# Patient Record
Sex: Female | Born: 1980 | Hispanic: Yes | Marital: Single | Smoking: Never smoker
Health system: Southern US, Community
[De-identification: ages and names within clinical notes are randomized; demographics above are authoritative.]

## PROBLEM LIST (undated history)

## (undated) DIAGNOSIS — D649 Anemia, unspecified: Secondary | ICD-10-CM

## (undated) DIAGNOSIS — E049 Nontoxic goiter, unspecified: Secondary | ICD-10-CM

## (undated) DIAGNOSIS — E663 Overweight: Secondary | ICD-10-CM

## (undated) DIAGNOSIS — D219 Benign neoplasm of connective and other soft tissue, unspecified: Secondary | ICD-10-CM

## (undated) HISTORY — DX: Overweight: E66.3

## (undated) HISTORY — PX: LUNG SURGERY: SHX703

## (undated) HISTORY — PX: LUNG LOBECTOMY: SHX167

---

## 2004-07-05 ENCOUNTER — Ambulatory Visit: Payer: Self-pay | Admitting: Family Medicine

## 2007-02-11 ENCOUNTER — Emergency Department: Payer: Self-pay | Admitting: Internal Medicine

## 2010-11-27 ENCOUNTER — Emergency Department: Payer: Self-pay | Admitting: *Deleted

## 2015-01-24 ENCOUNTER — Other Ambulatory Visit: Payer: Self-pay | Admitting: Advanced Practice Midwife

## 2015-01-24 ENCOUNTER — Ambulatory Visit
Admission: RE | Admit: 2015-01-24 | Discharge: 2015-01-24 | Disposition: A | Discharge status: Home / Self Care | Payer: Medicaid Other | Source: Ambulatory Visit | Attending: Advanced Practice Midwife | Admitting: Advanced Practice Midwife

## 2015-01-24 DIAGNOSIS — O26891 Other specified pregnancy related conditions, first trimester: Secondary | ICD-10-CM | POA: Diagnosis not present

## 2015-01-24 DIAGNOSIS — Z3A09 9 weeks gestation of pregnancy: Secondary | ICD-10-CM | POA: Diagnosis not present

## 2015-01-24 DIAGNOSIS — Z369 Encounter for antenatal screening, unspecified: Secondary | ICD-10-CM

## 2015-01-24 DIAGNOSIS — R1030 Lower abdominal pain, unspecified: Secondary | ICD-10-CM

## 2015-01-25 LAB — OB RESULTS CONSOLE HEPATITIS B SURFACE ANTIGEN: Hepatitis B Surface Ag: NEGATIVE

## 2015-01-25 LAB — OB RESULTS CONSOLE RUBELLA ANTIBODY, IGM: RUBELLA: IMMUNE

## 2015-01-25 LAB — OB RESULTS CONSOLE VARICELLA ZOSTER ANTIBODY, IGG: VARICELLA IGG: IMMUNE

## 2015-01-25 LAB — OB RESULTS CONSOLE HIV ANTIBODY (ROUTINE TESTING): HIV: NONREACTIVE

## 2015-01-25 LAB — OB RESULTS CONSOLE ABO/RH: RH TYPE: POSITIVE

## 2015-01-25 LAB — OB RESULTS CONSOLE RPR: RPR: NONREACTIVE

## 2015-01-25 LAB — OB RESULTS CONSOLE GC/CHLAMYDIA
CHLAMYDIA, DNA PROBE: NEGATIVE
Gonorrhea: NEGATIVE

## 2015-01-25 LAB — OB RESULTS CONSOLE ANTIBODY SCREEN: Antibody Screen: NEGATIVE

## 2015-02-02 ENCOUNTER — Encounter: Payer: Self-pay | Admitting: Emergency Medicine

## 2015-02-02 ENCOUNTER — Emergency Department
Admission: EM | Admit: 2015-02-02 | Discharge: 2015-02-02 | Disposition: A | Discharge status: Home / Self Care | Payer: Medicaid Other | Attending: Emergency Medicine | Admitting: Emergency Medicine

## 2015-02-02 ENCOUNTER — Emergency Department: Payer: Medicaid Other

## 2015-02-02 DIAGNOSIS — F419 Anxiety disorder, unspecified: Secondary | ICD-10-CM | POA: Diagnosis not present

## 2015-02-02 DIAGNOSIS — O200 Threatened abortion: Secondary | ICD-10-CM

## 2015-02-02 DIAGNOSIS — Z3A10 10 weeks gestation of pregnancy: Secondary | ICD-10-CM | POA: Diagnosis not present

## 2015-02-02 DIAGNOSIS — O99341 Other mental disorders complicating pregnancy, first trimester: Secondary | ICD-10-CM | POA: Insufficient documentation

## 2015-02-02 DIAGNOSIS — O209 Hemorrhage in early pregnancy, unspecified: Secondary | ICD-10-CM | POA: Diagnosis present

## 2015-02-02 LAB — URINALYSIS COMPLETE WITH MICROSCOPIC (ARMC ONLY)
Bilirubin Urine: NEGATIVE
Glucose, UA: NEGATIVE mg/dL
Ketones, ur: NEGATIVE mg/dL
LEUKOCYTES UA: NEGATIVE
Nitrite: NEGATIVE
PH: 7 (ref 5.0–8.0)
PROTEIN: NEGATIVE mg/dL
Specific Gravity, Urine: 1.009 (ref 1.005–1.030)

## 2015-02-02 LAB — CBC
HCT: 36.6 % (ref 35.0–47.0)
Hemoglobin: 12.4 g/dL (ref 12.0–16.0)
MCH: 29.4 pg (ref 26.0–34.0)
MCHC: 33.8 g/dL (ref 32.0–36.0)
MCV: 86.9 fL (ref 80.0–100.0)
PLATELETS: 210 10*3/uL (ref 150–440)
RBC: 4.21 MIL/uL (ref 3.80–5.20)
RDW: 17.3 % — AB (ref 11.5–14.5)
WBC: 8 10*3/uL (ref 3.6–11.0)

## 2015-02-02 LAB — HCG, QUANTITATIVE, PREGNANCY: HCG, BETA CHAIN, QUANT, S: 86007 m[IU]/mL — AB (ref <5)

## 2015-02-02 LAB — ABO/RH: ABO/RH(D): O POS

## 2015-02-02 NOTE — Discharge Instructions (Signed)
Hemorragia vaginal durante el embarazo (primer trimestre) °(Vaginal Bleeding During Pregnancy, First Trimester) °Durante los primeros meses de embarazo, es común tener una pequeña hemorragia vaginal (manchas). A veces, la hemorragia es normal y no representa un problema, pero en algunas ocasiones es un síntoma de algo grave. Asegúrese de decirle a su médico de inmediato si tiene algún tipo de hemorragia vaginal. °CUIDADOS EN EL HOGAR °· Controle su afección para ver si hay cambios. °· Siga las indicaciones de su médico con respecto al grado de actividad que puede tener. °· Si debe hacer reposo en cama: °¨ Es posible que deba quedarse en cama y levantarse únicamente para ir al baño. °¨ Quizás le permitan hacer algunas actividades. °¨ Si es necesario, planifique que alguien la ayude. °· Escriba: °¨ La cantidad de toallas higiénicas que usa cada día. °¨ La frecuencia con la que se cambia las toallas higiénicas. °¨ Indique que tan empapados (saturados) están. °· No use tampones. °· No se haga duchas vaginales. °· No tenga relaciones sexuales ni orgasmos hasta que el médico la autorice. °· Si elimina tejido por la vagina, guárdelo para mostrárselo al médico. °· Tome los medicamentos solamente como se lo haya indicado el médico. °· No tome aspirina, ya que puede causar hemorragias. °· Concurra a todas las visitas de control como se lo haya indicado el médico. °SOLICITE AYUDA SI:  °· Tiene una hemorragia vaginal. °· Tiene cólicos. °· Tiene dolores de parto. °· Tiene fiebre que no desaparece después de tomar medicamentos. °SOLICITE AYUDA DE INMEDIATO SI:  °· Siente cólicos muy intensos en la espalda o en el vientre (abdomen). °· Elimina coágulos grandes o tejido por la vagina. °· Tiene más hemorragia. °· Se siente débil o que va a desvanecerse. °· Pierde el conocimiento (se desmaya). °· Tiene escalofríos. °· Tiene una pérdida importante o sale líquido a borbotones por la vagina. °· Se desmaya mientras defeca. °ASEGÚRESE DE  QUE: °· Comprende estas instrucciones. °· Controlará su afección. °· Recibirá ayuda de inmediato si no mejora o si empeora. °Document Released: 10/11/2013 °ExitCare® Patient Information ©2015 [employer]. This information is not intended to replace advice given to you by your health care provider. Make sure you discuss any questions you have with your health care provider. ° °

## 2015-02-02 NOTE — ED Provider Notes (Signed)
[facility] Emergency Department Provider Note  ____________________________________________  Time seen: On arrival  I have reviewed the triage vital signs and the nursing notes.   HISTORY  Chief Complaint Vaginal Bleeding   Spanish interpreter used HPI Sheri Mack is a 34 y.o. female who presents with complaints of vaginal bleeding. She reports she is approximately [redacted] weeks pregnant. This her first pregnancy. She denies abdominal pain. No fevers no chills. No dysuria. She reports spotting noted only when she wipes after using the restroom.      History reviewed. No pertinent past medical history.  There are no active problems to display for this patient.   Past Surgical History  Procedure Laterality Date  . Lung surgery      Current Outpatient Rx  Name  Route  Sig  Dispense  Refill  . Prenatal Vit-Fe Fumarate-FA (PRENATAL MULTIVITAMIN) TABS tablet   Oral   Take 1 tablet by mouth daily at 12 noon.           Allergies Review of patient's allergies indicates no known allergies.  No family history on file.  Social History Social History  Substance Use Topics  . Smoking status: Never Smoker   . Smokeless tobacco: None  . Alcohol Use: No    Review of Systems  Constitutional: Negative for fever. Eyes: Negative for visual changes. ENT: Negative for sore throat Cardiovascular: Negative for chest pain. Respiratory: Negative for shortness of breath. Gastrointestinal: Negative for abdominal pain, vomiting and diarrhea. Genitourinary: Negative for dysuria. Positive for vaginal bleeding Musculoskeletal: Negative for back pain. Skin: Negative for rash. Neurological: Negative for headaches or focal weakness Psychiatric: Mild anxiety    ____________________________________________   PHYSICAL EXAM:  VITAL SIGNS: ED Triage Vitals  Enc Vitals Group     BP 02/02/15 0928 111/64 mmHg     Pulse Rate 02/02/15 0928 77     Resp  02/02/15 0928 14     Temp 02/02/15 0928 97.6 F (36.4 C)     Temp Source 02/02/15 0928 Oral     SpO2 02/02/15 0928 99 %     Weight 02/02/15 0928 157 lb (71.215 kg)     Height 02/02/15 0928 5' (1.524 m)     Head Cir --      Peak Flow --      Pain Score 02/02/15 0931 4     Pain Loc --      Pain Edu? --      Excl. in [place]? --      Constitutional: Alert and oriented. Well appearing and in no distress. Eyes: Conjunctivae are normal.  ENT   Head: Normocephalic and atraumatic.   Mouth/Throat: Mucous membranes are moist. Cardiovascular: Normal rate, regular rhythm. Normal and symmetric distal pulses are present in all extremities. No murmurs, rubs, or gallops. Respiratory: Normal respiratory effort without tachypnea nor retractions. Breath sounds are clear and equal bilaterally.  Gastrointestinal: Soft and non-tender in all quadrants. No distention. There is no CVA tenderness. Genitourinary: deferred Musculoskeletal: Nontender with normal range of motion in all extremities. No lower extremity tenderness nor edema. Neurologic:  Normal speech and language. No gross focal neurologic deficits are appreciated. Skin:  Skin is warm, dry and intact. No rash noted. Psychiatric: Mood and affect are normal. Patient exhibits appropriate insight and judgment.  ____________________________________________    LABS (pertinent positives/negatives)  Labs Reviewed  HCG, QUANTITATIVE, PREGNANCY - Abnormal; Notable for the following:    hCG, Beta Neomia Dear [address]007 (*)  All other components within normal limits  URINALYSIS COMPLETEWITH MICROSCOPIC ([facility] ONLY) - Abnormal; Notable for the following:    Color, Urine YELLOW (*)    APPearance CLEAR (*)    Hgb urine dipstick 1+ (*)    Bacteria, UA RARE (*)    Squamous Epithelial / LPF 0-5 (*)    All other components within normal limits  CBC - Abnormal; Notable for the following:    RDW 17.3 (*)    All other components within normal limits   ABO/RH    ____________________________________________   EKG  None  ____________________________________________    RADIOLOGY I have personally reviewed any xrays that were ordered on this patient: Ultrasound shows 10 week IUP  ____________________________________________   PROCEDURES  Procedure(s) performed: none  Critical Care performed: none  ____________________________________________   INITIAL IMPRESSION / ASSESSMENT AND PLAN / ED COURSE  Pertinent labs & imaging results that were available during my care of the patient were reviewed by me and considered in my medical decision making (see chart for details).  Patient is Rh+, ultrasound shows subchorionic hemorrhage but otherwise normal 10 week IUP. Recommend follow-up with GYN within the week.   ____________________________________________   FINAL CLINICAL IMPRESSION(S) / ED DIAGNOSES  Final diagnoses:  Threatened miscarriage     Lavonia Drafts, MD 02/02/15 1531

## 2015-02-02 NOTE — ED Notes (Signed)
Says about [redacted] week pregnant and now having bleeding on tissue when she wipes after voiding.  Her pcp tole her to come here.  Health department

## 2015-02-16 ENCOUNTER — Ambulatory Visit
Admission: RE | Admit: 2015-02-16 | Discharge: 2015-02-16 | Disposition: A | Discharge status: Home / Self Care | Payer: Medicaid Other | Source: Ambulatory Visit | Attending: Obstetrics and Gynecology | Admitting: Obstetrics and Gynecology

## 2015-02-16 ENCOUNTER — Ambulatory Visit (HOSPITAL_BASED_OUTPATIENT_CLINIC_OR_DEPARTMENT_OTHER)
Admission: RE | Admit: 2015-02-16 | Discharge: 2015-02-16 | Disposition: A | Discharge status: Home / Self Care | Payer: Medicaid Other | Source: Ambulatory Visit | Attending: Obstetrics and Gynecology | Admitting: Obstetrics and Gynecology

## 2015-02-16 DIAGNOSIS — Z369 Encounter for antenatal screening, unspecified: Secondary | ICD-10-CM

## 2015-02-16 DIAGNOSIS — Z8669 Personal history of other diseases of the nervous system and sense organs: Secondary | ICD-10-CM

## 2015-02-16 DIAGNOSIS — Z3A11 11 weeks gestation of pregnancy: Secondary | ICD-10-CM | POA: Insufficient documentation

## 2015-02-16 DIAGNOSIS — Z3401 Encounter for supervision of normal first pregnancy, first trimester: Secondary | ICD-10-CM

## 2015-02-16 DIAGNOSIS — Z6831 Body mass index (BMI) 31.0-31.9, adult: Secondary | ICD-10-CM | POA: Insufficient documentation

## 2015-02-16 DIAGNOSIS — O09511 Supervision of elderly primigravida, first trimester: Secondary | ICD-10-CM | POA: Insufficient documentation

## 2015-02-16 DIAGNOSIS — E663 Overweight: Secondary | ICD-10-CM | POA: Diagnosis not present

## 2015-02-16 DIAGNOSIS — O209 Hemorrhage in early pregnancy, unspecified: Secondary | ICD-10-CM | POA: Insufficient documentation

## 2015-02-16 DIAGNOSIS — Z9889 Other specified postprocedural states: Secondary | ICD-10-CM

## 2015-02-16 DIAGNOSIS — D219 Benign neoplasm of connective and other soft tissue, unspecified: Secondary | ICD-10-CM | POA: Insufficient documentation

## 2015-02-16 DIAGNOSIS — D252 Subserosal leiomyoma of uterus: Secondary | ICD-10-CM

## 2015-02-16 DIAGNOSIS — O3411 Maternal care for benign tumor of corpus uteri, first trimester: Secondary | ICD-10-CM | POA: Diagnosis not present

## 2015-02-16 DIAGNOSIS — Z3400 Encounter for supervision of normal first pregnancy, unspecified trimester: Secondary | ICD-10-CM | POA: Insufficient documentation

## 2015-02-16 DIAGNOSIS — Z902 Acquired absence of lung [part of]: Secondary | ICD-10-CM

## 2015-02-16 NOTE — Progress Notes (Addendum)
Referring Provider:   [facility] Department Length of Consultation: 40 minutes  Sheri Mack was referred to [facility] for genetic counseling because of advanced maternal age.  The patient will be 34 years old at the time of delivery.  This note summarizes the information we discussed with the aid of a Spanish interpreter.    We explained that the chance of a chromosome abnormality increases with maternal age.  Chromosomes and examples of chromosome problems were reviewed.  Humans typically have 46 chromosomes in each cell, with half passed through each sperm and egg.  Any change in the number or structure of chromosomes can increase the risk of problems in the physical and mental development of a pregnancy.   Based upon age of the patient, the chance of any chromosome abnormality was 1 in 11. The chance of Down syndrome, the most common chromosome problem associated with maternal age, was 1 in 68.  The risk of chromosome problems is in addition to the 3% general population risk for birth defects and mental retardation.  The greatest chance, of course, is that the baby would be born in good health.  We discussed the following prenatal screening and testing options for this pregnancy:  First trimester screening, which can include nuchal translucency ultrasound screen and/or first trimester maternal serum marker screening.  The nuchal translucency has approximately an 80% detection rate for Down syndrome and can be positive for other chromosome abnormalities as well as heart defects.  When combined with a maternal serum marker screening, the detection rate is up to 90% for Down syndrome and up to 97% for trisomy 18.     The chorionic villus sampling procedure is available for first trimester chromosome analysis.  This involves the withdrawal of a small amount of chorionic villi (tissue from the developing placenta).  Risk of pregnancy loss is estimated to be  approximately 1 in 200 to 1 in 100 (0.5 to 1%).  There is approximately a 1% (1 in 100) chance that the CVS chromosome results will be unclear.  Chorionic villi cannot be tested for neural tube defects.     Maternal serum marker screening, a blood test that measures pregnancy proteins, can provide risk assessments for Down syndrome, trisomy 18, and open neural tube defects (spina bifida, anencephaly). Because it does not directly examine the fetus, it cannot positively diagnose or rule out these problems.  Targeted ultrasound uses high frequency sound waves to create an image of the developing fetus.  An ultrasound is often recommended as a routine means of evaluating the pregnancy.  It is also used to screen for fetal anatomy problems (for example, a heart defect) that might be suggestive of a chromosomal or other abnormality.   Amniocentesis involves the removal of a small amount of amniotic fluid from the sac surrounding the fetus with the use of a thin needle inserted through the maternal abdomen and uterus.  Ultrasound guidance is used throughout the procedure.  Fetal cells from amniotic fluid are directly evaluated and > 99.5% of chromosome problems and > 98% of open neural tube defects can be detected. This procedure is generally performed after the 15th week of pregnancy.  The main risks to this procedure include complications leading to miscarriage in less than 1 in 200 cases (0.5%).  We also reviewed the availability of cell free fetal DNA testing from maternal blood to determine whether or not the baby may have either Down syndrome, trisomy 36, or trisomy 109.  This test utilizes a maternal blood sample and DNA sequencing technology to isolate circulating cell free fetal DNA from maternal plasma.  The fetal DNA can then be analyzed for DNA sequences that are derived from the three most common chromosomes involved in aneuploidy, chromosomes 13, 18, and 21.  If the overall amount of DNA is greater  than the expected level for any of these chromosomes, aneuploidy is suspected.  While we do not consider it a replacement for invasive testing and karyotype analysis, a negative result from this testing would be reassuring, though not a guarantee of a normal chromosome complement for the baby.  An abnormal result is certainly suggestive of an abnormal chromosome complement, though we would still recommend CVS or amniocentesis to confirm any findings from this testing.  Cystic Fibrosis screening was also discussed with the patient. Cystic fibrosis (CF) is one of the most common genetic conditions in persons of Caucasian ancestry.  This condition occurs in approximately 1 in 2,500 Caucasian persons and results in thickened secretions in the lungs, digestive, and reproductive systems.  For a baby to be at risk for having CF, both of the parents must be carriers for this condition.  Approximately 1 in 58 Caucasian persons is a carrier for CF.  Current carrier testing looks for the most common mutations in the gene for CF and can detect approximately 90% of carriers in the Caucasian population.  This means that the carrier screening can greatly reduce, but cannot eliminate, the chance for an individual to have a child with CF.  If an individual is found to be a carrier for CF, then carrier testing would be available for the partner. As part of [place] newborn screening profile, all babies born in the state of [place] will have a two-tier screening process.  Specimens are first tested to determine the concentration of immunoreactive trypsinogen (IRT).  The top 5% of specimens with the highest IRT values then undergo DNA testing using a panel of over 40 common CF mutations.    We obtained a detailed family history and pregnancy history.  The family history is unremarkable for birth defects, mental retardation, recurrent pregnancy loss or known chromosome abnormalities.    [name] stated  that this is the first pregnancy for she and her husband.  She reported no complications or exposures that would be expected to increase the risk for birth defects.  She met with Dr. Lehman Prom to discuss her history of surgery as a child on her lung.    After consideration of the options, Ms. Cloma Rahrig elected to proceed with an ultrasound today.  She declined any additional screening or testing options other than ultrasound.  An ultrasound was performed at the time of the visit.  The gestational age was consistent with  12 weeks.  Fetal anatomy could not be assessed due to early gestational age.  Please refer to the ultrasound report for details of that study.  She was scheduled to return in 6 weeks for an anatomy ultrasound.  Ms. Ashea Winiarski was encouraged to call with questions or concerns.  We can be contacted at [phone].   Wilburt Finlay, MS, CGC

## 2015-02-16 NOTE — Progress Notes (Signed)
[place] Consultation   Chief Complaint: h/o lung resection as a child Interview done with interpreter  HPI: Sheri Mack is a 34 y.o. G1P0 at 50w5dby lmp [facility]   for h/o lung infection and resection as child . Pt states her mother told her that she had a lung problem as a child that required a prolonged hospitalization as a toddler. Not TB . Her mother was told "someone may have dropped me" . Eventually the Dr removed that portion of lung. She  has had no problems since - she is able to run exercise and work like everyone else. No Asthma or other problems. Pt did not recognize pneumothorax or lung  Bleb as possible etiology. She has had normal chest Xrays as an adult.   She does have occasional HAs and infrequent migraines . She asks if she can use tylenol .     Past Medical History: Patient  has no past medical history on file.  Past Surgical History: She  has past surgical history that includes Lung surgery.  Obstetric History:  OB History    Gravida Para Term Preterm AB TAB SAB Ectopic Multiple Living   1              Gynecologic History:  Patient's last menstrual period was 12/22/2014.   Medications: vitamins  Allergies: Patient has No Known Allergies.  Social History: Patient  reports that she has never smoked. She does not have any smokeless tobacco history on file. She reports that she does not drink alcohol.  Family History: family history is not on file.  Review of Systems A full 12 point review of systems was negative or as noted in the History of Present Illness.  Physical Exam: LMP 12/22/2014  Well appearing slightly overweight latina female 5 cm scar on right posterior thorax  Chest is clear good breath sounds   Asessement: 1. Encounter for supervision of normal first pregnancy in first trimester   2. S/P lobectomy of lung   3. First trimester bleeding   4. Subserous  leiomyoma of uterus   5. Elderly primigravida in first trimester   6. Hx of migraines     Plan: 1- Recommended influenza vaccine and pneumovax (h/o pulmonary problems)  for patient  2- we discussed glucola screen for GDM  3 Given 3525primigravida  and mildly overweight we discussed option of  baby aspirin 81 mg daily  for preeclampsia prevention  4 I suggested use of tylenol for migraines , go to ER if worst HA of life  5 I reassured her regarding her small fibroid  6 given excellent exercise tolerance I did not recommend PFTs for h/o lung resection  7 pt met with gen counselor and declines antenatal testing or screening for aneuploidy 8 f/u scan for anatomy ordered in 6 weeks    Total time spent with the patient was 30 minutes with greater than 50% spent in counseling and coordination of care. We appreciate this interesting consult and will be happy to be involved in the ongoing care of Sheri Mack anyway her obstetricians desire.  ElGatha MayerD MaTyrrell

## 2015-03-09 NOTE — Progress Notes (Signed)
I reviewed care with the genetic counselor - I agree with her counseling of the pt for advancing maternal age  Sheri Mack

## 2015-03-09 NOTE — Addendum Note (Signed)
Encounter addended by: Gatha Mayer, MD on: 03/09/2015  8:38 AM<BR>     Documentation filed: Notes Section, Visit Diagnoses

## 2015-03-09 NOTE — Addendum Note (Signed)
Encounter addended by: Gatha Mayer, MD on: 03/09/2015  8:57 AM<BR>     Documentation filed: Charges VN

## 2015-03-30 ENCOUNTER — Ambulatory Visit
Admission: RE | Admit: 2015-03-30 | Discharge: 2015-03-30 | Disposition: A | Discharge status: Home / Self Care | Payer: Self-pay | Source: Ambulatory Visit | Attending: Maternal & Fetal Medicine | Admitting: Maternal & Fetal Medicine

## 2015-03-30 DIAGNOSIS — O09511 Supervision of elderly primigravida, first trimester: Secondary | ICD-10-CM | POA: Insufficient documentation

## 2015-03-30 DIAGNOSIS — Z3A18 18 weeks gestation of pregnancy: Secondary | ICD-10-CM | POA: Insufficient documentation

## 2015-05-09 ENCOUNTER — Other Ambulatory Visit: Payer: Self-pay | Admitting: Advanced Practice Midwife

## 2015-05-09 DIAGNOSIS — O4692 Antepartum hemorrhage, unspecified, second trimester: Secondary | ICD-10-CM

## 2015-05-18 ENCOUNTER — Ambulatory Visit
Admission: RE | Admit: 2015-05-18 | Discharge: 2015-05-18 | Disposition: A | Discharge status: Home / Self Care | Payer: Medicaid Other | Source: Ambulatory Visit | Attending: Obstetrics and Gynecology | Admitting: Obstetrics and Gynecology

## 2015-05-18 DIAGNOSIS — Z3A25 25 weeks gestation of pregnancy: Secondary | ICD-10-CM | POA: Insufficient documentation

## 2015-05-18 DIAGNOSIS — Z36 Encounter for antenatal screening of mother: Secondary | ICD-10-CM | POA: Insufficient documentation

## 2015-05-18 DIAGNOSIS — O4692 Antepartum hemorrhage, unspecified, second trimester: Secondary | ICD-10-CM | POA: Insufficient documentation

## 2015-05-31 LAB — OB RESULTS CONSOLE RPR: RPR: NONREACTIVE

## 2015-05-31 LAB — OB RESULTS CONSOLE HIV ANTIBODY (ROUTINE TESTING): HIV: NONREACTIVE

## 2015-08-04 LAB — OB RESULTS CONSOLE GC/CHLAMYDIA
CHLAMYDIA, DNA PROBE: NEGATIVE
GC PROBE AMP, GENITAL: NEGATIVE

## 2015-08-05 LAB — OB RESULTS CONSOLE GBS: GBS: NEGATIVE

## 2015-08-30 ENCOUNTER — Encounter: Payer: Self-pay | Admitting: *Deleted

## 2015-08-30 ENCOUNTER — Observation Stay
Admission: RE | Admit: 2015-08-30 | Discharge: 2015-08-30 | Disposition: A | Discharge status: Home / Self Care | Payer: MEDICAID | Attending: Obstetrics and Gynecology | Admitting: Obstetrics and Gynecology

## 2015-08-30 DIAGNOSIS — O1210 Gestational proteinuria, unspecified trimester: Principal | ICD-10-CM | POA: Insufficient documentation

## 2015-08-30 HISTORY — DX: Benign neoplasm of connective and other soft tissue, unspecified: D21.9

## 2015-08-30 HISTORY — DX: Nontoxic goiter, unspecified: E04.9

## 2015-08-30 HISTORY — DX: Anemia, unspecified: D64.9

## 2015-08-30 LAB — CBC WITH DIFFERENTIAL/PLATELET
Basophils Absolute: 0 10*3/uL (ref 0–0.1)
Basophils Relative: 0 %
Eosinophils Absolute: 0 10*3/uL (ref 0–0.7)
Eosinophils Relative: 1 %
HEMATOCRIT: 37 % (ref 35.0–47.0)
HEMOGLOBIN: 12.9 g/dL (ref 12.0–16.0)
LYMPHS ABS: 2.4 10*3/uL (ref 1.0–3.6)
LYMPHS PCT: 28 %
MCH: 33.3 pg (ref 26.0–34.0)
MCHC: 34.9 g/dL (ref 32.0–36.0)
MCV: 95.5 fL (ref 80.0–100.0)
Monocytes Absolute: 0.7 10*3/uL (ref 0.2–0.9)
Monocytes Relative: 8 %
NEUTROS ABS: 5.6 10*3/uL (ref 1.4–6.5)
NEUTROS PCT: 63 %
Platelets: 177 10*3/uL (ref 150–440)
RBC: 3.88 MIL/uL (ref 3.80–5.20)
RDW: 13.9 % (ref 11.5–14.5)
WBC: 8.7 10*3/uL (ref 3.6–11.0)

## 2015-08-30 LAB — COMPREHENSIVE METABOLIC PANEL
ALT: 25 U/L (ref 14–54)
AST: 43 U/L — AB (ref 15–41)
Albumin: 2.9 g/dL — ABNORMAL LOW (ref 3.5–5.0)
Alkaline Phosphatase: 264 U/L — ABNORMAL HIGH (ref 38–126)
Anion gap: 4 — ABNORMAL LOW (ref 5–15)
BUN: 19 mg/dL (ref 6–20)
CHLORIDE: 110 mmol/L (ref 101–111)
CO2: 20 mmol/L — AB (ref 22–32)
Calcium: 9.1 mg/dL (ref 8.9–10.3)
Creatinine, Ser: 1.01 mg/dL — ABNORMAL HIGH (ref 0.44–1.00)
Glucose, Bld: 118 mg/dL — ABNORMAL HIGH (ref 65–99)
POTASSIUM: 4.2 mmol/L (ref 3.5–5.1)
SODIUM: 134 mmol/L — AB (ref 135–145)
Total Bilirubin: 0.3 mg/dL (ref 0.3–1.2)
Total Protein: 6.2 g/dL — ABNORMAL LOW (ref 6.5–8.1)

## 2015-08-30 LAB — URINALYSIS COMPLETE WITH MICROSCOPIC (ARMC ONLY)
Bilirubin Urine: NEGATIVE
Glucose, UA: NEGATIVE mg/dL
KETONES UR: NEGATIVE mg/dL
Nitrite: NEGATIVE
Protein, ur: 30 mg/dL — AB
SPECIFIC GRAVITY, URINE: 1.019 (ref 1.005–1.030)
pH: 5 (ref 5.0–8.0)

## 2015-08-30 LAB — PROTEIN / CREATININE RATIO, URINE
Creatinine, Urine: 129 mg/dL
Protein Creatinine Ratio: 0.33 mg/mg{Cre} — ABNORMAL HIGH (ref 0.00–0.15)
Total Protein, Urine: 43 mg/dL

## 2015-08-30 LAB — URIC ACID: URIC ACID, SERUM: 5.7 mg/dL (ref 2.3–6.6)

## 2015-08-30 NOTE — Discharge Summary (Signed)
MD NOTE:  LMP: 11/22/14 EDD: 08/29/15  34yo G1P0 @ 40+1wks today sent from [facility] for "elevated BPs" and P:C ratio of 475 (per report, no report of how high the BP's were). No sx (no ha, blurry vision, n/v). No ctx, no lof, +FM.   APC: [facility] See records for labs and issues this pregnancy  O: Filed Vitals:   08/30/15 1652 08/30/15 1707 08/30/15 1721 08/30/15 1826  BP: 131/86 132/80 130/81 133/81  Pulse: 75 76 80 73  Temp:      TempSrc:      Resp:      Height:      Weight:       Per RN, no distress  P:C ratio 333 Urinalysis    Component Value Date/Time   COLORURINE YELLOW* 08/30/2015 1731   APPEARANCEUR CLOUDY* 08/30/2015 1731   LABSPEC 1.019 08/30/2015 1731   PHURINE 5.0 08/30/2015 1731   GLUCOSEU NEGATIVE 08/30/2015 1731   HGBUR 2+* 08/30/2015 1731   BILIRUBINUR NEGATIVE 08/30/2015 1731   KETONESUR NEGATIVE 08/30/2015 1731   PROTEINUR 30* 08/30/2015 1731   NITRITE NEGATIVE 08/30/2015 1731   LEUKOCYTESUR 3+* 08/30/2015 1731   CMP Latest Ref Rng 08/30/2015  Glucose 65 - 99 mg/dL 118(H)  BUN 6 - 20 mg/dL 19  Creatinine 0.44 - 1.00 mg/dL 1.01(H)  Sodium 135 - 145 mmol/L 134(L)  Potassium 3.5 - 5.1 mmol/L 4.2  Chloride 101 - 111 mmol/L 110  CO2 22 - 32 mmol/L 20(L)  Calcium 8.9 - 10.3 mg/dL 9.1  Total Protein 6.5 - 8.1 g/dL 6.2(L)  Total Bilirubin 0.3 - 1.2 mg/dL 0.3  Alkaline Phos 38 - 126 U/L 264(H)  AST 15 - 41 U/L 43(H)  ALT 14 - 54 U/L 25   CBC Latest Ref Rng 08/30/2015 02/02/2015  WBC 3.6 - 11.0 K/uL 8.7 8.0  Hemoglobin 12.0 - 16.0 g/dL 12.9 12.4  Hematocrit 35.0 - 47.0 % 37.0 36.6  Platelets 150 - 440 K/uL 177 210    EFM: 150 mod, +accel, no decel Toco: irritable  A/P: 34yo G1P0 @ 40+1wks with questionable elevated BP in office (no records), no elevated BPs in triage, elevated P:C ratio but in the context of 3+ leuk and possible UTI, reactive tracing, and labs only significant for slightly elevated AST (43) and Cr (1.01), which do not meet criteria for PEC.  Patient asymptomatic and has PD IOL scheduled for 3/26. Given the GA >40wks, will need documented BP's on 2 or more occasion SBP >140 or DBP >90 to have diagnosis of gHTN or PEC. Given the 3+ leuk in the sample, must r/o UTI as well. Also difficult to interpret P:C without documented BP elevations. Given the confusing clinical picture and while we wait for documentation from [facility], we will have patient do a 24hr urine protein collection and then return tomorrow night for NST, repeat CMP and UA, and repeat BP checks. Will send Ucx now. If BP's are documented as elevated as above in [facility] records, will induce for gHTN at term. If labs continue to increase to twice normal or 24hr urine protein >5g, will induce for questionable PEC at term.   Strict [facility] Strict precautions  Lorette Ang, MD

## 2015-08-30 NOTE — Discharge Instructions (Signed)
Return to birthplace tomorrow at 8:00pm to turn in 24 hour urine and repeat lab test.  Call provider or return to birthplace with:  1. Strong regular contractions every 5 minutes. 2. Leaking of fluid from your vagina 3. Vaginal bleeding: Bright red or heavy like a period 4. Decreased Fetal movement

## 2015-08-30 NOTE — OB Triage Note (Signed)
[facility] (3/17[facility] and Monday (3/20) and told her blood pressure was high. [facility] called her today and told her to come to the hospital for evaluation. Sheri Mack

## 2015-08-30 NOTE — Progress Notes (Signed)
Dr Newman Nip called, informed pt sent from [facility] for [facility] evaluation. Informed of lab results, BPs, Reactive fetal HR tracing. Order received to d/c home, 24 hr urine, return tomorrow, for repeat [facility] eval and to turn in 24 hr urine. Urine culture also added to orders due to UA results.

## 2015-08-31 ENCOUNTER — Inpatient Hospital Stay
Admission: RE | Admit: 2015-08-31 | Discharge: 2015-09-01 | Disposition: A | Discharge status: Home / Self Care | Payer: MEDICAID | Attending: Obstetrics and Gynecology | Admitting: Obstetrics and Gynecology

## 2015-08-31 DIAGNOSIS — Z3493 Encounter for supervision of normal pregnancy, unspecified, third trimester: Secondary | ICD-10-CM | POA: Insufficient documentation

## 2015-08-31 DIAGNOSIS — Z3A40 40 weeks gestation of pregnancy: Secondary | ICD-10-CM | POA: Insufficient documentation

## 2015-08-31 LAB — CBC WITH DIFFERENTIAL/PLATELET
BASOS PCT: 0 %
Basophils Absolute: 0 10*3/uL (ref 0–0.1)
EOS ABS: 0.1 10*3/uL (ref 0–0.7)
Eosinophils Relative: 1 %
HEMATOCRIT: 37.5 % (ref 35.0–47.0)
HEMOGLOBIN: 12.8 g/dL (ref 12.0–16.0)
Lymphocytes Relative: 27 %
Lymphs Abs: 2.2 10*3/uL (ref 1.0–3.6)
MCH: 32.7 pg (ref 26.0–34.0)
MCHC: 34.1 g/dL (ref 32.0–36.0)
MCV: 95.9 fL (ref 80.0–100.0)
Monocytes Absolute: 0.7 10*3/uL (ref 0.2–0.9)
Monocytes Relative: 8 %
NEUTROS ABS: 5.1 10*3/uL (ref 1.4–6.5)
NEUTROS PCT: 64 %
Platelets: 174 10*3/uL (ref 150–440)
RBC: 3.91 MIL/uL (ref 3.80–5.20)
RDW: 13.8 % (ref 11.5–14.5)
WBC: 8 10*3/uL (ref 3.6–11.0)

## 2015-08-31 LAB — COMPREHENSIVE METABOLIC PANEL
ALBUMIN: 2.9 g/dL — AB (ref 3.5–5.0)
ALK PHOS: 258 U/L — AB (ref 38–126)
ALT: 26 U/L (ref 14–54)
AST: 41 U/L (ref 15–41)
Anion gap: 7 (ref 5–15)
BUN: 15 mg/dL (ref 6–20)
CALCIUM: 9.4 mg/dL (ref 8.9–10.3)
CO2: 21 mmol/L — AB (ref 22–32)
CREATININE: 0.66 mg/dL (ref 0.44–1.00)
Chloride: 107 mmol/L (ref 101–111)
GFR calc Af Amer: >60 mL/min (ref >60)
GFR calc non Af Amer: >60 mL/min (ref >60)
GLUCOSE: 118 mg/dL — AB (ref 65–99)
Potassium: 4.1 mmol/L (ref 3.5–5.1)
Sodium: 135 mmol/L (ref 135–145)
Total Bilirubin: 0.2 mg/dL — ABNORMAL LOW (ref 0.3–1.2)
Total Protein: 6 g/dL — ABNORMAL LOW (ref 6.5–8.1)

## 2015-08-31 LAB — PROTEIN, URINE, 24 HOUR
COLLECTION INTERVAL-UPROT: 24 h
Protein, 24H Urine: 463 mg/d — ABNORMAL HIGH (ref 50–100)
Protein, Urine: 25 mg/dL
Urine Total Volume-UPROT: 1850 mL

## 2015-08-31 LAB — URIC ACID: Uric Acid, Serum: 5 mg/dL (ref 2.3–6.6)

## 2015-08-31 NOTE — OB Triage Note (Signed)
Pt. Here to return urine collection, have CMP drawn  and fetal monitoring, follow-up from yesterday's visit.

## 2015-08-31 NOTE — OB Triage Provider Note (Signed)
History     CSN: [id number]  Arrival date and time: 08/31/15 2020   None      Chief Complaint  Patient presents with  . Non-stress Test    returning for blook work and NST/BP   HPI Sheri Mack is a 35 yo G1P0 at 40+2 weeks by LMP: 11/22/14 with an EDD: 08/29/15 presenting today to f/u from yesterday's triage visit with questionable elevated BPs at [facility], but normal BPs in triage and abnormal P/C ratio.  She dropped off a 24 hour tonight and we will repeat pre-eclampsia labs, serial BPs, and NST.  She reports good fetal movement.  She denies HA, visual disturbances, epigastric pain, ctxs, vaginal bleeding, or LOF. She receives care at [facility] and is scheduled for an induction on Sunday for postdates.     OB History    Gravida Para Term Preterm AB TAB SAB Ectopic Multiple Living   1 0 0 0 0 0 0 0 0 0       Past Medical History  Diagnosis Date  . Anemia   . Fibroid   . Enlarged thyroid     no further follow-up needed    Past Surgical History  Procedure Laterality Date  . Lung surgery    . Lung lobectomy Right     as a child    No family history on file.  Social History  Substance Use Topics  . Smoking status: Never Smoker   . Smokeless tobacco: Never Used  . Alcohol Use: No    Allergies: No Known Allergies  Prescriptions prior to admission  Medication Sig Dispense Refill Last Dose  . Prenatal Vit-Fe Fumarate-FA (PRENATAL MULTIVITAMIN) TABS tablet Take 1 tablet by mouth daily at 12 noon.   08/31/2015 at Unknown time    Review of Systems  Constitutional: Negative.   HENT: Negative.   Eyes: Negative.   Respiratory: Negative.   Genitourinary: Negative.   Musculoskeletal: Negative.   Skin: Negative.   Neurological: Negative.   Endo/Heme/Allergies: Negative.   Psychiatric/Behavioral: Negative.    Physical Exam   Blood pressure 128/88, pulse 87, temperature 98.8 F (37.1 C), temperature source Oral, resp. rate 16, weight 86.637 kg (191 lb), last  menstrual period 11/22/2014.  Physical Exam  Constitutional: She is oriented to person, place, and time. She appears well-developed and well-nourished.  Cardiovascular: Normal rate and regular rhythm.   Respiratory: Effort normal and breath sounds normal.  GI: Soft. Bowel sounds are normal.  Genitourinary: Uterus normal.  Gravid, non-tender  Neurological: She is alert and oriented to person, place, and time. She displays normal reflexes.  Skin: Skin is warm and dry.  Fetal tracing: Baseline: 130 bpm/Moderate variability/ +accels/ no decels TOCO: uterus quiet   Results for orders placed or performed during the hospital encounter of 08/31/15 (from the past 24 hour(s))  CBC with Differential/Platelet     Status: None   Collection Time: 08/31/15  8:41 PM  Result Value Ref Range   WBC 8.0 3.6 - 11.0 K/uL   RBC 3.91 3.80 - 5.20 MIL/uL   Hemoglobin 12.8 12.0 - 16.0 g/dL   HCT 37.5 35.0 - 47.0 %   MCV 95.9 80.0 - 100.0 fL   MCH 32.7 26.0 - 34.0 pg   MCHC 34.1 32.0 - 36.0 g/dL   RDW 13.8 11.5 - 14.5 %   Platelets 174 150 - 440 K/uL   Neutrophils Relative % 64 %   Neutro Abs 5.1 1.4 - 6.5 K/uL   Lymphocytes Relative 27 %  Lymphs Abs 2.2 1.0 - 3.6 K/uL   Monocytes Relative 8 %   Monocytes Absolute 0.7 0.2 - 0.9 K/uL   Eosinophils Relative 1 %   Eosinophils Absolute 0.1 0 - 0.7 K/uL   Basophils Relative 0 %   Basophils Absolute 0.0 0 - 0.1 K/uL  Comprehensive metabolic panel     Status: Abnormal   Collection Time: 08/31/15  8:41 PM  Result Value Ref Range   Sodium 135 135 - 145 mmol/L   Potassium 4.1 3.5 - 5.1 mmol/L   Chloride 107 101 - 111 mmol/L   CO2 21 (L) 22 - 32 mmol/L   Glucose, Bld 118 (H) 65 - 99 mg/dL   BUN 15 6 - 20 mg/dL   Creatinine, Ser 0.66 0.44 - 1.00 mg/dL   Calcium 9.4 8.9 - 10.3 mg/dL   Total Protein 6.0 (L) 6.5 - 8.1 g/dL   Albumin 2.9 (L) 3.5 - 5.0 g/dL   AST 41 15 - 41 U/L   ALT 26 14 - 54 U/L   Alkaline Phosphatase 258 (H) 38 - 126 U/L   Total  Bilirubin 0.2 (L) 0.3 - 1.2 mg/dL   GFR calc non Af Amer >60 >60 mL/min   GFR calc Af Amer >60 >60 mL/min   Anion gap 7 5 - 15  Uric acid     Status: None   Collection Time: 08/31/15  8:41 PM  Result Value Ref Range   Uric Acid, Serum 5.0 2.3 - 6.6 mg/dL  Protein, urine, 24 hour     Status: Abnormal   Collection Time: 08/31/15  8:49 PM  Result Value Ref Range   Urine Total Volume-UPROT 1850 mL   Collection Interval-UPROT 24 hours   Protein, Urine 25 mg/dL   Protein, 24H Urine 463 (H) 50 - 100 mg/day     Procedures   Assessment and Plan  IUP at 40+2 weeks Normal BPs, no severe features Labs WNL Category 1 Fetal Tracing - reactive and reassuring NST FKC's daily Labor precautions and warning s/s reviewed Pre-eclampsia s/s reviewed  D/C home  Return to Labor and Delivery for IOL on Sunday 09/03/15  Darliss Cheney 08/31/2015, 9:38 PM

## 2015-09-01 ENCOUNTER — Encounter: Payer: Self-pay | Admitting: *Deleted

## 2015-09-01 LAB — CREATININE CLEARANCE, URINE, 24 HOUR
COLLECTION INTERVAL-CRCL: 24 h
CREAT CLEAR: 146 mL/min — AB (ref 75–115)
CREATININE, URINE: 75 mg/dL
Creatinine, 24H Ur: 1388 mg/d (ref 600–1800)
URINE TOTAL VOLUME-CRCL: 1850 mL

## 2015-09-02 LAB — CULTURE, OB URINE: Special Requests: NORMAL

## 2015-09-03 ENCOUNTER — Inpatient Hospital Stay
Admission: AD | Admit: 2015-09-03 | Discharge: 2015-09-07 | DRG: 765 | Disposition: A | Discharge status: Home / Self Care | Payer: Medicaid Other | Source: Ambulatory Visit | Attending: Obstetrics and Gynecology | Admitting: Obstetrics and Gynecology

## 2015-09-03 DIAGNOSIS — O1404 Mild to moderate pre-eclampsia, complicating childbirth: Secondary | ICD-10-CM | POA: Diagnosis present

## 2015-09-03 DIAGNOSIS — O480 Post-term pregnancy: Secondary | ICD-10-CM | POA: Diagnosis present

## 2015-09-03 DIAGNOSIS — Z902 Acquired absence of lung [part of]: Secondary | ICD-10-CM | POA: Diagnosis not present

## 2015-09-03 DIAGNOSIS — O329XX0 Maternal care for malpresentation of fetus, unspecified, not applicable or unspecified: Secondary | ICD-10-CM | POA: Diagnosis present

## 2015-09-03 DIAGNOSIS — D62 Acute posthemorrhagic anemia: Secondary | ICD-10-CM | POA: Diagnosis present

## 2015-09-03 DIAGNOSIS — Z3A40 40 weeks gestation of pregnancy: Secondary | ICD-10-CM | POA: Diagnosis not present

## 2015-09-03 LAB — TYPE AND SCREEN
ABO/RH(D): O POS
Antibody Screen: NEGATIVE

## 2015-09-03 LAB — CBC
HCT: 37.5 % (ref 35.0–47.0)
Hemoglobin: 13.3 g/dL (ref 12.0–16.0)
MCH: 34 pg (ref 26.0–34.0)
MCHC: 35.4 g/dL (ref 32.0–36.0)
MCV: 96 fL (ref 80.0–100.0)
PLATELETS: 171 10*3/uL (ref 150–440)
RBC: 3.91 MIL/uL (ref 3.80–5.20)
RDW: 14.1 % (ref 11.5–14.5)
WBC: 8.5 10*3/uL (ref 3.6–11.0)

## 2015-09-03 MED ORDER — CITRIC ACID-SODIUM CITRATE 334-500 MG/5ML PO SOLN
30.0000 mL | ORAL | Status: DC | PRN
  Administered 2015-09-04: 30 mL via ORAL

## 2015-09-03 MED ORDER — OXYTOCIN 40 UNITS IN LACTATED RINGERS INFUSION - SIMPLE MED
1.0000 m[IU]/min | INTRAVENOUS | Status: DC
  Administered 2015-09-03: 1 m[IU]/min via INTRAVENOUS
  Administered 2015-09-04: 40 mL via INTRAVENOUS
  Filled 2015-09-03: qty 1000

## 2015-09-03 MED ORDER — OXYTOCIN 10 UNIT/ML IJ SOLN
INTRAMUSCULAR | Status: AC
  Filled 2015-09-03: qty 2

## 2015-09-03 MED ORDER — LACTATED RINGERS IV SOLN
INTRAVENOUS | Status: DC
  Administered 2015-09-03 (×2): via INTRAVENOUS

## 2015-09-03 MED ORDER — TERBUTALINE SULFATE 1 MG/ML IJ SOLN: 0.2500 mg | Freq: Once | INTRAMUSCULAR | Status: DC | PRN

## 2015-09-03 MED ORDER — OXYTOCIN 40 UNITS IN LACTATED RINGERS INFUSION - SIMPLE MED: 2.5000 [IU]/h | INTRAVENOUS | Status: DC

## 2015-09-03 MED ORDER — AMMONIA AROMATIC IN INHA
RESPIRATORY_TRACT | Status: AC
  Filled 2015-09-03: qty 10

## 2015-09-03 MED ORDER — LIDOCAINE HCL (PF) 1 % IJ SOLN: 30.0000 mL | INTRAMUSCULAR | Status: DC | PRN

## 2015-09-03 MED ORDER — BUTORPHANOL TARTRATE 1 MG/ML IJ SOLN
1.0000 mg | INTRAMUSCULAR | Status: DC | PRN
  Administered 2015-09-04: 1 mg via INTRAVENOUS
  Filled 2015-09-03: qty 1

## 2015-09-03 MED ORDER — MISOPROSTOL 200 MCG PO TABS
ORAL_TABLET | ORAL | Status: AC
  Filled 2015-09-03: qty 4

## 2015-09-03 MED ORDER — LACTATED RINGERS IV SOLN
500.0000 mL | INTRAVENOUS | Status: DC | PRN
Start: 2015-09-03 — End: 2015-09-04
  Administered 2015-09-04: 500 mL via INTRAVENOUS
  Administered 2015-09-04: 06:00:00 via INTRAVENOUS
  Administered 2015-09-04: 500 mL via INTRAVENOUS

## 2015-09-03 MED ORDER — LIDOCAINE HCL (PF) 1 % IJ SOLN
INTRAMUSCULAR | Status: AC
  Filled 2015-09-03: qty 30

## 2015-09-03 MED ORDER — ONDANSETRON HCL 4 MG/2ML IJ SOLN: 4.0000 mg | Freq: Four times a day (QID) | INTRAMUSCULAR | Status: DC | PRN

## 2015-09-03 MED ORDER — OXYTOCIN BOLUS FROM INFUSION: 500.0000 mL | INTRAVENOUS | Status: DC

## 2015-09-03 MED ORDER — ACETAMINOPHEN 325 MG PO TABS: 650.0000 mg | ORAL_TABLET | ORAL | Status: DC | PRN

## 2015-09-03 NOTE — Progress Notes (Signed)
S: Resting comfortably without epidural. + CTX, no LOF, VB., Via Interpreter (IPAD), pt informed that we will break her water. Pt denies painful contractions that requires medicine at this time O: Filed Vitals:   09/03/15 1204 09/03/15 1219 09/03/15 1334 09/03/15 1716  BP: 122/91 124/87 123/85 124/86  Pulse: 90 88 83 74  Temp:   98.1 F (36.7 C) 98.1 F (36.7 C)  TempSrc:   Oral Oral  Resp:   16 16  Height:      Weight:        Gen: NAD, AAOx3      Abd: FNTTP      Ext: Non-tender, Nonedmeatous    FHT:+mod var + accelerations no decelerations 1 decel to 100 with AROM at 1944 clear and sl blood tinged TOCO: Q 3  min SVE: 4/100/vtx-1 Pitocin at 5 mun/min per protocol  A/P:  35 y.o. yo G1P0000 at [redacted]w[redacted]d for postdates IOL.   Labor pregressing  FWB: Reassuring Cat 1 tracing. EFW &#15oz  GBS: neg  BP 85-91  Continue to monitor FHT/UC  Monitor VS.  Anticipate need for pain control   Catheryn Bacon 7:50 PM

## 2015-09-03 NOTE — Progress Notes (Signed)
S: Resting comfortably without epidural. + CTX, no LOF, VB. O: Filed Vitals:   09/03/15 1204 09/03/15 1219 09/03/15 1334 09/03/15 1716  BP: 122/91 124/87 123/85 124/86  Pulse: 90 88 83 74  Temp:   98.1 F (36.7 C) 98.1 F (36.7 C)  TempSrc:   Oral Oral  Resp:   16 16  Height:      Weight:        Gen: NAD, AAOx3      Abd: FNTTP      Ext: Non-tender, Nonedmeatous    FHT: +mod var + accelerations no decelerations, earlier pt had 3 late decels in a row and Pitocin cut back and now Cat 1 strip. TOCO: Q 2 min SVE:3/80/vtx-2   A/P:  35 y.o. yo G1P0000 at [redacted]w[redacted]d for IOL for postdates and pre-ecclampsia.   Labor:progressing  FWB: Reassuring Cat 1 tracing.  Continue to monitor UC/FHT's and Pitocin per protocol.    Sheri Mack 5:25 PM

## 2015-09-03 NOTE — H&P (Signed)
Obstetrics Admission History & Physical  Referring Provider: [facility] Primary OBGYN: [facility]   Chief Complaint: UC's hurting today.  History of Present Illness  35 y.o. G1P0000 @ [redacted]w[redacted]d here for UC's becoming more uncomfortable today.  Pt had LMP of 11/22/14 & EDD of 08/29/15 c/w 11 6/7 week [place] with EDD of 08/29/15.Pt is scheduled for IOL at 1900 and it was decided to just keep pt since her cx is 3/80%/vtx-2. Pt was given an option to go home and eat and come back at 1900 but, she does not want to leave. Advised that she can have clear liqs till delivery. Pt was seen at [place] for [facility] and advised to start on Baby ASA 81 mg po daily.   [name] presents for "UC's becoming more uncomfortable today, No LOF, NO VB, No decreased FM.  Review of Systems: Positive for  UC's becoming uncomfortable today.  Otherwise, her 12 point review of systems is negative or as noted in the History of Present Illness.  Patient Active Problem List   Diagnosis Date Noted  . Indication for care in labor or delivery 09/03/2015  . Proteinuria affecting pregnancy 08/30/2015  . Supervision of normal first pregnancy 02/16/2015  . 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 First trimester bleeding 02/16/2015  . Fibroid 02/16/2015  . Elderly primigravida in first trimester 02/16/2015  . Hx of migraines 02/16/2015     PMHx:  Past Medical History  Diagnosis Date  . Anemia   . Fibroid   . 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 Lung surgery    . Lung lobectomy Right     as a child  Due to infection at age 59, pt does not know all the details Medications:  Prescriptions prior to admission  Medication Sig Dispense Refill Last Dose  . Prenatal Vit-Fe  Fumarate-FA (PRENATAL MULTIVITAMIN) TABS tablet Take 1 tablet by mouth daily at 12 noon.   09/03/2015 at Unknown time   Allergies: has No Known Allergies. OBHx:  OB History  Gravida Para Term Preterm AB SAB TAB Ectopic Multiple Living  1 0 0 0 0 0 0 0 0 0     # Outcome Date GA Lbr Len/2nd Weight Sex Delivery Anes PTL Lv  1 Current               GYNHx:  History of abnormal pap smears: not found History of STIs: No..             FHx: History reviewed. No pertinent family history. Soc Hx:  Social History   Social History  . Marital Status: Single    Spouse Name: N/A  . Number of Children: N/A  . Years of Education: N/A   Occupational History  . Not on file.   Social History Main Topics  . Smoking status: Never Smoker   . Smokeless tobacco: Never Used  . Alcohol Use: No  . Drug Use: No  . Sexual Activity: Yes   Other Topics Concern  . Not on file   Social History Narrative   FOB is involved with the pregnancy   Objective   Filed Vitals:   09/03/15 1219 09/03/15 1334  BP: 124/87 123/85  Pulse: 88 83  Temp:  98.1 F (36.7 C)  Resp:  16   Temp:  [98.1 F (36.7 C)] 98.1 F (36.7 C) (03/26 1334) Pulse Rate:  [83-97] 83 (03/26 1334) Resp:  [16] 16 (03/26 1334) BP: (122-134)/(74-91) 123/85 mmHg (03/26 1334) Weight:  [191 lb (86.637 kg)] 191 lb (86.637 kg) (03/26 1150) Temp (24hrs), Avg:98.1 F (36.7 C), Min:98.1 F (36.7 C), Max:98.1 F (36.7 C)  No intake or output data in the 24 hours ending 09/03/15 1420    EFM:  Toco: q 10 mins, lasting 60 secs, mild to palpation  General: 35 yo Hispanic female , Well nourished, well developed female in no acute distress.  Skin:  Warm and dry.  Cardiovascular: S1S2, RRR, No M/R/G. Respiratory:  Clear to auscultation bilateral. Normal respiratory effort. No W/R/R. Abdomen: Gravid: EFW 7#14 oz Neuro/Psych:  Normal mood and affect.    SVE: 3/80/vtx-2 [place]/EFW: 7#14oz  Labs  CBC and Type and Screen drawn,  Proteinuria 463 from 08/31/15 in 24 h urine Ultrasounds   Perinatal info  O POS/ Rubella  Immune / RPR NR /HIV NR,   /HepB Surf Ag: neg/ Pap: not found/GC/CH neg/Antibody neg/Varicella immune/sickle cell: neg  TDAP: none given, GBS   Assessment & Plan   35 y.o. G1P0000 @ [redacted]w[redacted]d with signs and symptoms, with likely mild pre-ecclampsia.  IUP: at 40 5/7 weeks with mild pre-ecclampsia GBS: not found on chart documents, will have to call [facility] Analgesia: Plan Stadol and anesthesia External fetal and uterine monitors till delivered. Start Pitocin per protocol. Will discuss plan of care with Dr Shary Decamp since pt is being induced earlier and also has mild pre-ecclampsia.  Catheryn Bacon, MSN, CNM, [facility]

## 2015-09-03 NOTE — Progress Notes (Addendum)
S: Resting comfortably with*out epidural. + CTX, no LOF, VB O: Filed Vitals:   09/03/15 1150 09/03/15 1204 09/03/15 1219 09/03/15 1334  BP:  122/91 124/87 123/85  Pulse:  90 88 83  Temp:    98.1 F (36.7 C)  TempSrc:    Oral  Resp:    16  Height: 5\' 2"  (1.575 m)     Weight: 191 lb (86.637 kg)       Gen: NAD, AAOx3      Abd: FNTTP      Ext: Non-tender, Nonedmeatous    FHT: + mod var + accelerations no decelerations TOCO: Q 2.5 -5 mins min SVE: 3/80/vtx-2   A/P:  35 y.o. yo G1P0000 at [redacted]w[redacted]d for labor S/S. Mild pre-ecclampsia.   Labor:  Early   FWB: Reassuring Cat 1 tracing. EFW  7#15oz GBS: neg Dr Leafy Ro aware of the patient admission and agrees with the plan of care. Highest BP 124/91. No HA, No blurred vision, no RUQ pain Catheryn Bacon 3:34 PM

## 2015-09-03 NOTE — Discharge Summary (Signed)
Obstetric Discharge Summary   Patient ID: Sheri Mack MRN: [id number] DOB/AGE: [birth year] 35 y.o.   Date of Admission: 09/03/2015  Date of Discharge:  3/  /17 Admitting Diagnosis: Induction of labor at [redacted]w[redacted]d  Secondary Diagnosis: Mild Pre-ecclampsia; arrest of dilation; Cat II strip remote from delivery  Mode of Delivery: Primary LTCS     Discharge Diagnosis: No other diagnosis   Intrapartum Procedures: AROM, Ext fetal and uterine monitors; maternal repositioing for Cat II strip   Post partum procedures: none  Complications: none   Brief Hospital Course  Primary [place] Section: Sheri Mack is a G1P0000 who underwent cesarean section on 09/04/15.  Patient had an uncomplicated surgery; for further details of this surgery, please refer to the operative note.  Patient had an uncomplicated postpartum course.  By time of discharge on POD#3, her pain was controlled on oral pain medications; she had appropriate lochia and was ambulating, voiding without difficulty, tolerating regular diet and passing flatus.   She was deemed stable for discharge to home.    Labs: CBC Latest Ref Rng 09/03/2015 08/31/2015 08/30/2015  WBC 3.6 - 11.0 K/uL 8.5 8.0 8.7  Hemoglobin 12.0 - 16.0 g/dL 13.3 12.8 12.9  Hematocrit 35.0 - 47.0 % 37.5 37.5 37.0  Platelets 150 - 440 K/uL 171 174 177   O POS  Physical exam:  Blood pressure 124/86, pulse 74, temperature 98.1 F (36.7 C), temperature source Oral, resp. rate 16, height 5\' 2"  (1.575 m), weight 191 lb (86.637 kg), last menstrual period 11/22/2014. General: alert and no distress Lochia: appropriate Abdomen: soft, NT Uterine Fundus: firm Incision: healing well, no significant drainage, no dehiscence, no significant erythema Extremities: No evidence of DVT seen on physical exam. No lower extremity edema.  Discharge Instructions: Per After Visit Summary. Activity: Advance as tolerated. Pelvic rest for 6 weeks.  Also refer to After  Visit Summary Diet: Regular Medications:   Medication List    ASK your doctor about these medications        prenatal multivitamin Tabs tablet  Take 1 tablet by mouth daily at 12 noon.       Outpatient follow up:  Postpartum contraception: no method  Discharged Condition: good  Discharged to: home   Newborn Data:  Baby boy   Disposition:home with mother  Baby Feeding: Bottle and Breast  Catheryn Bacon, CNM 09/03/2015

## 2015-09-03 NOTE — Progress Notes (Signed)
S: Resting comfortably without epidural. + CTX, + LOF, no VB. Pt is doing well with UC's. Had wanted an epidural and now decided she would do without one.  O: Filed Vitals:   09/03/15 1334 09/03/15 1716 09/03/15 1937 09/03/15 2114  BP: 123/85 124/86    Pulse: 83 74    Temp: 98.1 F (36.7 C) 98.1 F (36.7 C) 99 F (37.2 C) 98.4 F (36.9 C)  TempSrc: Oral Oral Oral Oral  Resp: 16 16 18 20   Height:      Weight:       Gen: NAD, AAOx3      Abd: FNTTP      Ext: Non-tender, Nonedmeatous    FHT: + mod var + accelerations no decelerations TOCO: Q 2-5 min SVE: 7/100%/vtx-1   A/P:  35 y.o. yo G1P0000 at [redacted]w[redacted]d for IOL due to post-dates and mild pre-ecclampsia with minimally elevated BP's,. No neurological signs   Labor: active labor with cervical progress  FWB: Reassuring Cat 1 tracing.   GBS: neg  Continue to monitor UC/FHT's/ with Pitocin at 5 mun/min  Antic SVD.   Catheryn Bacon 10:49 PM

## 2015-09-04 ENCOUNTER — Encounter: Payer: Self-pay | Admitting: Anesthesiology

## 2015-09-04 ENCOUNTER — Encounter
Admission: AD | Disposition: A | Discharge status: Home / Self Care | Payer: Self-pay | Source: Ambulatory Visit | Attending: Obstetrics and Gynecology

## 2015-09-04 ENCOUNTER — Inpatient Hospital Stay: Payer: Medicaid Other | Admitting: Anesthesiology

## 2015-09-04 LAB — RPR: RPR Ser Ql: NONREACTIVE

## 2015-09-04 SURGERY — Surgical Case
Anesthesia: Spinal | Wound class: Clean Contaminated

## 2015-09-04 SURGERY — Surgical Case
Anesthesia: Spinal

## 2015-09-04 MED ORDER — LACTATED RINGERS IV SOLN
INTRAVENOUS | Status: DC
  Administered 2015-09-04 – 2015-09-05 (×2): via INTRAVENOUS

## 2015-09-04 MED ORDER — ZOLPIDEM TARTRATE 5 MG PO TABS: 5.0000 mg | ORAL_TABLET | Freq: Every evening | ORAL | Status: DC | PRN

## 2015-09-04 MED ORDER — SIMETHICONE 80 MG PO CHEW
80.0000 mg | CHEWABLE_TABLET | ORAL | Status: DC
  Administered 2015-09-04 – 2015-09-06 (×3): 80 mg via ORAL
  Filled 2015-09-04: qty 1

## 2015-09-04 MED ORDER — MEASLES, MUMPS & RUBELLA VAC ~~LOC~~ INJ
0.5000 mL | INJECTION | Freq: Once | SUBCUTANEOUS | Status: DC
  Filled 2015-09-04: qty 0.5

## 2015-09-04 MED ORDER — LACTATED RINGERS IV SOLN: INTRAVENOUS | Status: DC

## 2015-09-04 MED ORDER — ACETAMINOPHEN 325 MG PO TABS: 650.0000 mg | ORAL_TABLET | ORAL | Status: DC | PRN

## 2015-09-04 MED ORDER — FENTANYL CITRATE (PF) 100 MCG/2ML IJ SOLN: 25.0000 ug | INTRAMUSCULAR | Status: DC | PRN

## 2015-09-04 MED ORDER — MENTHOL 3 MG MT LOZG
1.0000 | LOZENGE | OROMUCOSAL | Status: DC | PRN
  Filled 2015-09-04: qty 9

## 2015-09-04 MED ORDER — LACTATED RINGERS IV SOLN
2.5000 [IU]/h | INTRAVENOUS | Status: AC
  Filled 2015-09-04: qty 10

## 2015-09-04 MED ORDER — BISACODYL 10 MG RE SUPP: 10.0000 mg | Freq: Every day | RECTAL | Status: DC | PRN

## 2015-09-04 MED ORDER — PRENATAL MULTIVITAMIN CH
1.0000 | ORAL_TABLET | Freq: Every day | ORAL | Status: DC
  Administered 2015-09-04 – 2015-09-07 (×4): 1 via ORAL
  Filled 2015-09-04 (×5): qty 1

## 2015-09-04 MED ORDER — CEFAZOLIN SODIUM-DEXTROSE 2-3 GM-% IV SOLR
INTRAVENOUS | Status: AC
  Administered 2015-09-04: 2000 mg
  Filled 2015-09-04: qty 50

## 2015-09-04 MED ORDER — LANOLIN HYDROUS EX OINT: 1.0000 "application " | TOPICAL_OINTMENT | CUTANEOUS | Status: DC | PRN

## 2015-09-04 MED ORDER — FLEET ENEMA 7-19 GM/118ML RE ENEM: 1.0000 | ENEMA | Freq: Every day | RECTAL | Status: DC | PRN

## 2015-09-04 MED ORDER — BUPIVACAINE IN DEXTROSE 0.75-8.25 % IT SOLN
INTRATHECAL | Status: DC | PRN
  Administered 2015-09-04: 1.8 mL via INTRATHECAL

## 2015-09-04 MED ORDER — WITCH HAZEL-GLYCERIN EX PADS
1.0000 | MEDICATED_PAD | CUTANEOUS | Status: DC | PRN
Start: 2015-09-04 — End: 2015-09-04

## 2015-09-04 MED ORDER — LANOLIN HYDROUS EX OINT
1.0000 | TOPICAL_OINTMENT | CUTANEOUS | Status: DC | PRN
Start: 2015-09-04 — End: 2015-09-07

## 2015-09-04 MED ORDER — SENNOSIDES-DOCUSATE SODIUM 8.6-50 MG PO TABS
2.0000 | ORAL_TABLET | ORAL | Status: DC
  Administered 2015-09-04 – 2015-09-06 (×3): 2 via ORAL
  Filled 2015-09-04 (×3): qty 2

## 2015-09-04 MED ORDER — LACTATED RINGERS IV SOLN
2.5000 [IU]/h | INTRAVENOUS | Status: DC
  Filled 2015-09-04: qty 10

## 2015-09-04 MED ORDER — MENTHOL 3 MG MT LOZG: 1.0000 | LOZENGE | OROMUCOSAL | Status: DC | PRN

## 2015-09-04 MED ORDER — SENNOSIDES-DOCUSATE SODIUM 8.6-50 MG PO TABS: 2.0000 | ORAL_TABLET | ORAL | Status: DC

## 2015-09-04 MED ORDER — FENTANYL CITRATE (PF) 100 MCG/2ML IJ SOLN
INTRAMUSCULAR | Status: DC | PRN
  Administered 2015-09-04: 50 ug via INTRAVENOUS

## 2015-09-04 MED ORDER — DIPHENHYDRAMINE HCL 25 MG PO CAPS: 25.0000 mg | ORAL_CAPSULE | Freq: Four times a day (QID) | ORAL | Status: DC | PRN

## 2015-09-04 MED ORDER — SIMETHICONE 80 MG PO CHEW: 80.0000 mg | CHEWABLE_TABLET | ORAL | Status: DC | PRN

## 2015-09-04 MED ORDER — ONDANSETRON HCL 4 MG/2ML IJ SOLN: 4.0000 mg | Freq: Once | INTRAMUSCULAR | Status: DC | PRN

## 2015-09-04 MED ORDER — OXYCODONE-ACETAMINOPHEN 5-325 MG PO TABS
1.0000 | ORAL_TABLET | ORAL | Status: DC | PRN
  Administered 2015-09-04: 1 via ORAL
  Filled 2015-09-04: qty 1

## 2015-09-04 MED ORDER — IBUPROFEN 600 MG PO TABS: 600.0000 mg | ORAL_TABLET | Freq: Four times a day (QID) | ORAL | Status: DC

## 2015-09-04 MED ORDER — DIBUCAINE 1 % RE OINT: 1.0000 "application " | TOPICAL_OINTMENT | RECTAL | Status: DC | PRN

## 2015-09-04 MED ORDER — PRENATAL MULTIVITAMIN CH: 1.0000 | ORAL_TABLET | Freq: Every day | ORAL | Status: DC

## 2015-09-04 MED ORDER — PHENYLEPHRINE HCL 10 MG/ML IJ SOLN
INTRAMUSCULAR | Status: DC | PRN
  Administered 2015-09-04 (×2): 100 ug via INTRAVENOUS

## 2015-09-04 MED ORDER — OXYCODONE-ACETAMINOPHEN 5-325 MG PO TABS: 2.0000 | ORAL_TABLET | ORAL | Status: DC | PRN

## 2015-09-04 MED ORDER — WITCH HAZEL-GLYCERIN EX PADS: 1.0000 "application " | MEDICATED_PAD | CUTANEOUS | Status: DC | PRN

## 2015-09-04 MED ORDER — IBUPROFEN 600 MG PO TABS
600.0000 mg | ORAL_TABLET | Freq: Four times a day (QID) | ORAL | Status: DC
  Administered 2015-09-04 – 2015-09-07 (×13): 600 mg via ORAL
  Filled 2015-09-04 (×15): qty 1

## 2015-09-04 MED ORDER — TETANUS-DIPHTH-ACELL PERTUSSIS 5-2.5-18.5 LF-MCG/0.5 IM SUSP: 0.5000 mL | Freq: Once | INTRAMUSCULAR | Status: DC

## 2015-09-04 MED ORDER — SIMETHICONE 80 MG PO CHEW
80.0000 mg | CHEWABLE_TABLET | Freq: Three times a day (TID) | ORAL | Status: DC
  Administered 2015-09-04 – 2015-09-07 (×8): 80 mg via ORAL
  Filled 2015-09-04 (×11): qty 1

## 2015-09-04 MED ORDER — CITRIC ACID-SODIUM CITRATE 334-500 MG/5ML PO SOLN
ORAL | Status: AC
  Administered 2015-09-04: 30 mL via ORAL
  Filled 2015-09-04: qty 15

## 2015-09-04 MED ORDER — SIMETHICONE 80 MG PO CHEW: 80.0000 mg | CHEWABLE_TABLET | ORAL | Status: DC

## 2015-09-04 MED ORDER — FENTANYL CITRATE (PF) 100 MCG/2ML IJ SOLN
25.0000 ug | INTRAMUSCULAR | Status: DC | PRN
  Administered 2015-09-04 (×3): 25 ug via INTRAVENOUS
  Filled 2015-09-04: qty 2

## 2015-09-04 MED ORDER — SIMETHICONE 80 MG PO CHEW
80.0000 mg | CHEWABLE_TABLET | Freq: Three times a day (TID) | ORAL | Status: DC
Start: 2015-09-04 — End: 2015-09-04

## 2015-09-04 SURGICAL SUPPLY — 29 items
BENZOIN TINCTURE PRP APPL 2/3 (GAUZE/BANDAGES/DRESSINGS) ×3 IMPLANT
CANISTER SUCT 3000ML (MISCELLANEOUS) ×3 IMPLANT
CATH KIT ON-Q SILVERSOAK 5IN (CATHETERS) IMPLANT
CHLORAPREP W/TINT 26ML (MISCELLANEOUS) ×3 IMPLANT
CLOSURE WOUND 1/2 X4 (GAUZE/BANDAGES/DRESSINGS) ×1
DRSG TEGADERM 4X4.75 (GAUZE/BANDAGES/DRESSINGS) ×3 IMPLANT
DRSG TELFA 3X8 NADH (GAUZE/BANDAGES/DRESSINGS) ×3 IMPLANT
ELECT REM PT RETURN 9FT ADLT (ELECTROSURGICAL) ×3
ELECTRODE REM PT RTRN 9FT ADLT (ELECTROSURGICAL) ×1 IMPLANT
GAUZE SPONGE 4X4 12PLY STRL (GAUZE/BANDAGES/DRESSINGS) ×3 IMPLANT
GOWN STRL REUS W/ TWL LRG LVL3 (GOWN DISPOSABLE) ×3 IMPLANT
GOWN STRL REUS W/TWL LRG LVL3 (GOWN DISPOSABLE) ×6
NS IRRIG 1000ML POUR BTL (IV SOLUTION) ×3 IMPLANT
PAD OB MATERNITY 4.3X12.25 (PERSONAL CARE ITEMS) ×3 IMPLANT
PAD PREP 24X41 OB/GYN DISP (PERSONAL CARE ITEMS) ×3 IMPLANT
SPONGE LAP 18X18 5 PK (GAUZE/BANDAGES/DRESSINGS) ×6 IMPLANT
STRIP CLOSURE SKIN 1/2X4 (GAUZE/BANDAGES/DRESSINGS) ×2 IMPLANT
SUT MNCRL 0 1X36 CT-1 (SUTURE) ×1 IMPLANT
SUT MNCRL 4-0 (SUTURE) ×2
SUT MNCRL 4-0 27XMFL (SUTURE) ×1
SUT MONOCRYL 0 (SUTURE) ×2
SUT PDS AB 1 TP1 96 (SUTURE) IMPLANT
SUT PLAIN 2 0 XLH (SUTURE) IMPLANT
SUT PLAIN GUT 2-0 30 C14 SG823 (SUTURE)
SUT VIC AB 0 CT1 36 (SUTURE) ×12 IMPLANT
SUT VIC AB 3-0 SH 27 (SUTURE)
SUT VIC AB 3-0 SH 27X BRD (SUTURE) IMPLANT
SUTURE MNCRL 4-0 27XMF (SUTURE) ×1 IMPLANT
SUTURE PLN GUT2-0 30 C14 SG823 (SUTURE) IMPLANT

## 2015-09-04 SURGICAL SUPPLY — 22 items
CANISTER SUCT 3000ML (MISCELLANEOUS) IMPLANT
CATH KIT ON-Q SILVERSOAK 5IN (CATHETERS) IMPLANT
CHLORAPREP W/TINT 26ML (MISCELLANEOUS) IMPLANT
DRSG TELFA 3X8 NADH (GAUZE/BANDAGES/DRESSINGS) IMPLANT
ELECT REM PT RETURN 9FT ADLT (ELECTROSURGICAL)
ELECTRODE REM PT RTRN 9FT ADLT (ELECTROSURGICAL) IMPLANT
GAUZE SPONGE 4X4 12PLY STRL (GAUZE/BANDAGES/DRESSINGS) IMPLANT
GOWN STRL REUS W/ TWL LRG LVL3 (GOWN DISPOSABLE) IMPLANT
GOWN STRL REUS W/TWL LRG LVL3 (GOWN DISPOSABLE)
NS IRRIG 1000ML POUR BTL (IV SOLUTION) IMPLANT
PAD OB MATERNITY 4.3X12.25 (PERSONAL CARE ITEMS) IMPLANT
PAD PREP 24X41 OB/GYN DISP (PERSONAL CARE ITEMS) IMPLANT
SUT MNCRL 4-0 (SUTURE)
SUT MNCRL 4-0 27XMFL (SUTURE)
SUT PDS AB 1 TP1 96 (SUTURE) IMPLANT
SUT PLAIN 2 0 XLH (SUTURE) IMPLANT
SUT PLAIN GUT 2-0 30 C14 SG823 (SUTURE)
SUT VIC AB 0 CT1 36 (SUTURE) IMPLANT
SUT VIC AB 3-0 SH 27 (SUTURE)
SUT VIC AB 3-0 SH 27X BRD (SUTURE) IMPLANT
SUTURE MNCRL 4-0 27XMF (SUTURE) IMPLANT
SUTURE PLN GUT2-0 30 C14 SG823 (SUTURE) IMPLANT

## 2015-09-04 NOTE — Lactation Note (Signed)
This note was copied from a baby's chart. Assisted mom with positioning in comfortable biological cross cradle hold skin to skin.  Sheri Mack has been sleepy since delivery.  He is more awake at this breast feed.  Sheri Mack has a thin, tight frenulum, but has been able to extend tongue slightly past gum line.  Demonstrated hand expression to entice him to latch and to rub in nipples after breast feeding.  Worked on depth to maintain latch.  Frequent stimulation and massaging of breast is still required to keep him nutritively sucking at the breast.  Discussed routine newborn feeding patterns and normal course of lactation through interpreter.  Explained supply and demand and encouraged frequent breast feeding watching for feeding cues to bring in mature milk and ensure a plentiful milk supply.  Lactation name and number is written on white board and encouraged to call for questions, concerns or assistance.

## 2015-09-04 NOTE — Progress Notes (Signed)
Patient ID: Sheri Mack, female   DOB: Mar 20, 1981, 35 y.o.   MRN: [id number]  G1P0 at 40+6wks admitted in early labor, with augmentation of labor now turned off because of fetal intolerance to labor. She has not made cervical progress since 0020.  I was called to exam patient. On my exam, 8-9cm at 0 station, with molding in direct OP position.  My review of the FHT - mod variability throughout, but recurrent late decels with occasional early decels and variable decels for several hours. No recent accels, and negative fetal scalp stimulation on exam just now. Did have an extended 7 min of bradycardia around 12:30.   Urgent C/S called for failure to progress and Cat II strip without resolution with conservative measures (fluid bolus, maternal repositioning, amnioinfusion).   It is evident that when the patient was trial pushed to reduce the cervix, the baby did have decel into the 60s for several minutes.  Using the iPad interpreter, the reasoning for and risks of surgery were discussed with the patient.  These included but were not limited to: bleeding which may require transfusion or reoperation; infection which may require antibiotics; injury to bowel, bladder, ureters or other surrounding organs; injury to the fetus; need for additional procedures including hysterectomy in the event of a life-threatening hemorrhage; placental abnormalities wth subsequent pregnancies, incisional problems, thromboembolic phenomenon and other postoperative/anesthesia complications. The patient concurred with the proposed plan, giving informed written consent for the procedure.   Anesthesia and OR aware. Preoperative prophylactic antibiotics and SCDs ordered on call to the OR.  To OR promptly.

## 2015-09-04 NOTE — Anesthesia Preprocedure Evaluation (Signed)
Anesthesia Evaluation  Patient identified by MRN, date of birth, ID band Patient awake    Reviewed: Allergy & Precautions, H&P , NPO status , Patient's Chart, lab work & pertinent test results  History of Anesthesia Complications Negative for: history of anesthetic complications  Airway Mallampati: III  TM Distance: >3 FB Neck ROM: full    Dental  (+) Poor Dentition   Pulmonary neg pulmonary ROS,    Pulmonary exam normal breath sounds clear to auscultation       Cardiovascular negative cardio ROS Normal cardiovascular exam Rhythm:regular Rate:Normal     Neuro/Psych negative neurological ROS  negative psych ROS   GI/Hepatic negative GI ROS, Neg liver ROS,   Endo/Other    Renal/GU negative Renal ROS  negative genitourinary   Musculoskeletal   Abdominal   Peds  Hematology negative hematology ROS (+) anemia ,   Anesthesia Other Findings Past Medical History:   Anemia                                                       Fibroid                                                      Enlarged thyroid                                               Comment:no further follow-up needed  Past Surgical History:   LUNG SURGERY                                                  LUNG LOBECTOMY                                  Right                Comment:as a child  BMI    Body Mass Index   34.92 kg/m 2      Reproductive/Obstetrics (+) Pregnancy                             Anesthesia Physical Anesthesia Plan  ASA: III and emergent  Anesthesia Plan: Spinal   Post-op Pain Management:    Induction:   Airway Management Planned:   Additional Equipment:   Intra-op Plan:   Post-operative Plan:   Informed Consent: I have reviewed the patients History and Physical, chart, labs and discussed the procedure including the risks, benefits and alternatives for the proposed anesthesia with the  patient or authorized representative who has indicated his/her understanding and acceptance.   Dental Advisory Given  Plan Discussed with: Anesthesiologist, CRNA and Surgeon  Anesthesia Plan Comments:         Anesthesia Quick Evaluation

## 2015-09-04 NOTE — Anesthesia Procedure Notes (Addendum)
Spinal  Start time: 09/04/2015 6:20 AM End time: 09/04/2015 6:28 AM Staffing Anesthesiologist: Marline Backbone F Performed by: anesthesiologist  Preanesthetic Checklist Completed: patient identified, site marked, surgical consent, pre-op evaluation, timeout performed, IV checked, risks and benefits discussed and monitors and equipment checked Spinal Block Patient position: sitting Prep: Betadine Patient monitoring: heart rate and blood pressure Approach: midline Location: L3-4 Injection technique: single-shot Needle Needle type: Quincke  Needle gauge: 25 G Needle length: 9 cm Needle insertion depth: 6 cm Assessment Sensory level: T6  Performed by: Jervon Ream Oxygen Delivery Method: Nasal cannula

## 2015-09-04 NOTE — Transfer of Care (Signed)
Immediate Anesthesia Transfer of Care Note  Patient: Sheri Mack  Procedure(s) Performed: Procedure(s): CESAREAN SECTION  Patient Location: Mother/Baby  Anesthesia Type:Spinal  Level of Consciousness: awake, alert  and oriented  Airway & Oxygen Therapy: Patient Spontanous Breathing  Post-op Assessment: Report given to RN and Post -op Vital signs reviewed and stable  Post vital signs: Reviewed and stable  Last Vitals:  Filed Vitals:   09/04/15 0745 09/04/15 0800  BP: 112/65 133/72  Pulse: 102 92  Temp: 36.6 C   Resp: 18 18    Complications: No apparent anesthesia complications

## 2015-09-04 NOTE — Progress Notes (Signed)
I have verified all charting done by Gaspar Garbe (student-RN). Alecia Lemming RN

## 2015-09-04 NOTE — Progress Notes (Signed)
S: Uncomfortable without epidural. + CTX, no LOF, VB O: Filed Vitals:   09/03/15 1937 09/03/15 2114 09/03/15 2244 09/04/15 0000  BP: 137/89 133/76 131/74 132/65  Pulse:   85   Temp: 99 F (37.2 C) 98.4 F (36.9 C) 98.4 F (36.9 C) 98.7 F (37.1 C)  TempSrc: Oral Oral Oral Oral  Resp: 18 20 20 20   Height:      Weight:       Gen: NAD, AAOx3      Abd: FNTTP      Ext: Non-tender, Nonedmeatous    FHT: + mod var + accelerations, early decelerations TOCO: Q 1-3 min, lasts 45-60 secs SVE: 8-9/100/vtx0  Baby in an OP position (straight) and attempting to reduce cx and see if pt can push past the cx since pt does not have an epidural. Pt does move the baby with pushing but, is having difficulty pushing. She stops and moans and groans and then pushes for 3 sec and then stops. Interpreter on IPAD and encouraging pt to push.  A/P:  35 y.o. yo G1P0000 at [redacted]w[redacted]d for IOL.   Labor: pt has had no cervical change with the baby in the OP position. The cx can be reduced with pushing but, pt stops pushing and the baby goes back up.   Pt in knee chest position to augment rotation of the vtx from OP to OA. Pt is tired and attempting to cooperate but, just does not follow on pushing as needed.  Pt had declined epidural earlier and now is to fatigued to push effectively.  Due to earlier variables, do not feel that the labor can be pushed with Pitocin being increased as fetus may not tolerate.  If pt can push and move the baby down with reduction of the cx, possibly we can push and deliver the baby.  If pt cannot push due to fatigue, will notify anesthesia for an epidural for rest and rotate pt side to side with the peanut ball.  Will notify Dr Shary Decamp of pt progress when a direction is known. If pt cannot make progress, will discuss LTCS  But, as long as baby and mom are tolerating labor, will continue till maternal exhaustion is declared  FWB: Reassuring Cat 1 tracing.     Sheri Mack 4:43 AM

## 2015-09-04 NOTE — Progress Notes (Signed)
Dr Leafy Ro here and evaluated pt and called an Urgent LTCS due to failure to progress and fetal intolerance of labor. Pt via Interpreter agrees to [employer].

## 2015-09-04 NOTE — Progress Notes (Signed)
Called due to variables occurring with each UC. Variables are becoming constant and lasting 30-40 secs, + accels, mod variability, no other decels. Pt up to void. Cx exam: IUPC placed and will start amnioinfusion. FHR mod variability. Pitocin at 5 mun/min. Pt tol labor well. 500 ml bolus of NS to be given and then 100 ml an hour till delivery.

## 2015-09-04 NOTE — Op Note (Signed)
  Cesarean Section Procedure Note  Date of procedure: 09/04/2015   Pre-operative Diagnosis: Intrauterine pregnancy at [redacted]w[redacted]d; iol for late term; mild PreE; fetal intolerance to labor with Cat II strip remote from delivery; arrest of dilation  Post-operative Diagnosis: same, delivered.  Procedure: Primary Low Transverse Cesarean Section through Pfannenstiel incision  Surgeon: Benjaman Kindler, MD  Assistant(s):  Glenis Smoker, CNM  Anesthesia: Spinal anesthesia  Anesthesiologist: No responsible provider has been recorded for the case. Anesthesiologist: Iver Nestle, MD CRNA: Lesle Reek, CRNA  Estimated Blood Loss:  538mL         Drains: none         Total IV Fluids: 714ml  Urine Output: 187ml         Specimens: cord gas, cord blood         Complications:  None; patient tolerated the procedure well.         Disposition: PACU - hemodynamically stable.         Condition: stable  Findings:  A female infant "Mateo" in LOT presentation. Amniotic fluid - Clear  Birth weight 8#2oz.  Apgars of 8 and 9 at one and five minutes respectively.  Intact placenta with a three-vessel cord.  Grossly normal uterus, tubes and ovaries bilaterally. no intraabdominal adhesions were noted.  Indications: failure to progress: arrest of descent, failure to progress: arrest of dilation, malpresentation: LOP->LOT and non-reassuring fetal status  Procedure Details  The patient was taken to Operating Room, identified as the correct patient and the procedure verified as C-Section Delivery. A formal Time Out was held with all team members present and in agreement.  After induction of anesthesia, the patient was draped and prepped in the usual sterile manner. A Pfannenstiel skin incision was made and carried down through the subcutaneous tissue to the fascia. Fascial incision was made and extended transversely with the Mayo scissors. The fascia was separated from the underlying rectus tissue  superiorly and inferiorly. The peritoneum was identified and entered bluntly. Peritoneal incision was extended longitudinally. The utero-vesical peritoneal reflection was incised transversely and a bladder flap was created digitally.   A low transverse hysterotomy was made. The fetus was delivered atraumatically. The umbilical cord was clamped x2 and cut and the infant was handed to the awaiting pediatricians. The placenta was removed intact and appeared normal, intact, and with a 3-vessel cord.   The uterus was exteriorized and cleared of all clot and debris. The hysterotomy was closed with running sutures of 0-Vicryl. A second imbricating layer was placed with the same suture. Excellent hemostasis was observed. The peritoneal cavity was cleared of all clots and debris. The uterus was returned to the abdomen.   The pelvis was irrigated and again, excellent hemostasis was noted. The fascia was then reapproximated with running sutures of 0 Vicryl. The subcutaneous tissue was reapproximated with interrupted sutures of 0-Monocryl. The skin was reapproximated with a 4-0 Monocryl subcuticular stitches.   Instrument, sponge, and needle counts were correct prior to the abdominal closure and at the conclusion of the case.   The patient tolerated the procedure well and was transferred to the recovery room in stable condition.   Benjaman Kindler, MD 09/04/2015

## 2015-09-04 NOTE — Progress Notes (Signed)
S: Pushing stopped due to 4 late decels after pushing. + CTX, no LOF, VB O: Filed Vitals:   09/03/15 1937 09/03/15 2114 09/03/15 2244 09/04/15 0000  BP: 137/89 133/76 131/74 132/65  Pulse:   85   Temp: 99 F (37.2 C) 98.4 F (36.9 C) 98.4 F (36.9 C) 98.7 F (37.1 C)  TempSrc: Oral Oral Oral Oral  Resp: 18 20 20 20   Height:      Weight:       Gen: NAD, AAOx3      Abd: FNTTP      Ext: Non-tender, Nonedmeatous    FHT: + mod var + accelerations, 4 late decelerations after 4 attempts at pushing. Pitocin off.  TOCO: Q 2 min SVE: 9/100/vtx straight OP at +1 station   A/P:  35 y.o. yo G1P0000 at [redacted]w[redacted]d for IOL for postdates.   Labor: Pit off due to late decels.   FWB: Reassuring Cat 2 (late decels) after pushing with mod varibility  Dr Leafy Ro called 5 mins ago and informed of the situation. Feel at this point that proceeding to LTCS is necessary as cx has not progressed, pt cannot push without having late decel's of the fetus, vacuum is not an option for CNM as baby is to high. Preparing for possible LTCS if Dr Shary Decamp agrees that situation cannot progress. Pt is now having early decels after the Pitocin is turned off. Amnioinfusion stopped. Strip is now a Cat 1 with mod variabiltiy and early decels.     Catheryn Bacon 5:22 AM

## 2015-09-04 NOTE — Progress Notes (Signed)
S: Hurting some with UC's.. + CTX, + LOF, No  VB. Tol labor well. ODanley Danker Vitals:   09/03/15 1937 09/03/15 2114 09/03/15 2244 09/04/15 0000  BP: 137/89 133/76 131/74 132/65  Pulse:   85   Temp: 99 F (37.2 C) 98.4 F (36.9 C) 98.4 F (36.9 C) 98.7 F (37.1 C)  TempSrc: Oral Oral Oral Oral  Resp: 18 20 20 20   Height:      Weight:        Gen: NAD, AAOx3      Abd: FNTTP      Ext: Non-tender, Nonedmeatous    FHT:+mod var + accelerations, +variable decels with UC's down to 90-100 with full recovery and +accels.Cat 2  TOCO: Q 2-3  Min, sl mod SVE: 8/100/vtx-1   A/P:  35 y.o. yo G1P0000 at [redacted]w[redacted]d for IOL with mild pre-ecclampsia  Labor: Progressing  FWB: Reassuring Cat 2 tracing due to variables that resolve and just started. Will reposition to reduce if possible. Pitocin on at 5 mun/min. If variables deepen or worsen, will cut Pitocin off.  Reposition for reduced variables.        Consider fluid bolus if needed.       Can place O2 if needed.       Will give pt some Stadol for pain.   Catheryn Bacon 12:42 AM

## 2015-09-05 LAB — CBC
HCT: 30.3 % — ABNORMAL LOW (ref 35.0–47.0)
HEMOGLOBIN: 10.5 g/dL — AB (ref 12.0–16.0)
MCH: 33 pg (ref 26.0–34.0)
MCHC: 34.6 g/dL (ref 32.0–36.0)
MCV: 95.4 fL (ref 80.0–100.0)
Platelets: 150 10*3/uL (ref 150–440)
RBC: 3.18 MIL/uL — AB (ref 3.80–5.20)
RDW: 14.2 % (ref 11.5–14.5)
WBC: 13.3 10*3/uL — AB (ref 3.6–11.0)

## 2015-09-05 NOTE — Progress Notes (Signed)
I have verified all charting done by Gaspar Garbe (student-RN). Alecia Lemming RN

## 2015-09-05 NOTE — Anesthesia Postprocedure Evaluation (Signed)
Anesthesia Post Note  Patient: Sheri Mack  Procedure(s) Performed: Procedure(s) (LRB): CESAREAN SECTION (N/A)  Patient location during evaluation: Mother Baby Anesthesia Type: Spinal Level of consciousness: awake and alert Pain management: pain level controlled Vital Signs Assessment: post-procedure vital signs reviewed and stable Respiratory status: spontaneous breathing and respiratory function stable Cardiovascular status: blood pressure returned to baseline and stable Postop Assessment: spinal receding Anesthetic complications: no    Last Vitals:  Filed Vitals:   09/05/15 0442 09/05/15 0745  BP: 105/71 120/72  Pulse: 91 94  Temp: 36.7 C 37.1 C  Resp: 18 18    Last Pain:  Filed Vitals:   09/05/15 0748  PainSc: 0-No pain                 Alison Stalling

## 2015-09-05 NOTE — Progress Notes (Signed)
  Subjective:   Feeling well and wants to take a shower today  Objective:  Blood pressure 120/72, pulse 94, temperature 98.8 F (37.1 C), temperature source Oral, resp. rate 18, height 5\' 2"  (1.575 m), weight 191 lb (86.637 kg), last menstrual period 11/22/2014, SpO2 99 %, unknown if currently breastfeeding.  General: NAD Heart: S1S2, RRR, No M/R/G Lungs: no W/R/R. Pulmonary: no increased work of breathing Abdomen: non-distended, non-tender, fundus firm at level of umbilicus Incision: Dressing dry and intact, no drainage noted.  Extremities: no edema, no erythema, no tenderness  Results for orders placed or performed during the hospital encounter of 09/03/15 (from the past 72 hour(s))  CBC     Status: None   Collection Time: 09/03/15  2:25 PM  Result Value Ref Range   WBC 8.5 3.6 - 11.0 K/uL   RBC 3.91 3.80 - 5.20 MIL/uL   Hemoglobin 13.3 12.0 - 16.0 g/dL   HCT 37.5 35.0 - 47.0 %   MCV 96.0 80.0 - 100.0 fL   MCH 34.0 26.0 - 34.0 pg   MCHC 35.4 32.0 - 36.0 g/dL   RDW 14.1 11.5 - 14.5 %   Platelets 171 150 - 440 K/uL  Type and screen     Status: None   Collection Time: 09/03/15  2:25 PM  Result Value Ref Range   ABO/RH(D) O POS    Antibody Screen NEG    Sample Expiration 09/06/2015   RPR     Status: None   Collection Time: 09/03/15  2:25 PM  Result Value Ref Range   RPR Ser Ql Non Reactive Non Reactive    Comment: (NOTE) Performed At: [facility] [place], [place] [id number] Lindon Romp MD [id number]   CBC     Status: Abnormal   Collection Time: 09/05/15  6:19 AM  Result Value Ref Range   WBC 13.3 (H) 3.6 - 11.0 K/uL   RBC 3.18 (L) 3.80 - 5.20 MIL/uL   Hemoglobin 10.5 (L) 12.0 - 16.0 g/dL    Comment: RESULT REPEATED AND VERIFIED   HCT 30.3 (L) 35.0 - 47.0 %   MCV 95.4 80.0 - 100.0 fL   MCH 33.0 26.0 - 34.0 pg   MCHC 34.6 32.0 - 36.0 g/dL   RDW 14.2 11.5 - 14.5 %   Platelets 150 150 - 440 K/uL     Assessment:   35 y.o. G1P1001  postoperativeday # [id number]  Plan:  1) Acute blood loss anemia - hemodynamically stable and asymptomatic - po ferrous sulfate  2) --/--/O POS (03/26 1425) / Immune (08/17 0000) / Varicella  immune  3) TDAP status: not found in chart  4) Breast  5) Continue lactation

## 2015-09-06 NOTE — Progress Notes (Signed)
Subjective: Postpartum Day 2: Cesarean Delivery Patient reports no problems  Objective: Vital signs in last 24 hours: Temp:  [98.1 F (36.7 C)-98.7 F (37.1 C)] 98.7 F (37.1 C) (03/29 0749) Pulse Rate:  [80-110] 80 (03/29 0749) Resp:  [18-20] 20 (03/29 0749) BP: (110-133)/(69-84) 110/76 mmHg (03/29 0749) SpO2:  [99 %-100 %] 99 % (03/28 2024)  Physical Exam:  General: alert and cooperative Lochia: appropriate Uterine Fundus: firm Incision: healing well DVT Evaluation: No evidence of DVT seen on physical exam. Lungs CTA  CV RRR  Recent Labs  09/03/15 1425 09/05/15 0619  HGB 13.3 10.5*  HCT 37.5 30.3*    Assessment/Plan: Status post Cesarean section. Doing well postoperatively.  Continue current care. Anticipate d/c in am   Taveon Enyeart 09/06/2015, 8:45 AM

## 2015-09-07 MED ORDER — OXYCODONE-ACETAMINOPHEN 5-325 MG PO TABS: 1.0000 | ORAL_TABLET | Freq: Four times a day (QID) | ORAL | Status: DC | PRN

## 2015-09-07 MED ORDER — IBUPROFEN 600 MG PO TABS: 600.0000 mg | ORAL_TABLET | Freq: Four times a day (QID) | ORAL | Status: AC

## 2015-09-07 MED ORDER — LANOLIN HYDROUS EX OINT: 1.0000 "application " | TOPICAL_OINTMENT | CUTANEOUS | Status: DC | PRN

## 2015-09-07 NOTE — Discharge Instructions (Signed)
Cuidados en el postparto luego de un parto por cesrea  (Postpartum Care After Cesarean Delivery) Despus del parto (perodo de postparto), la estada normal en el hospital es de 24-72 horas. Si hubo problemas con el trabajo de parto o el parto, o si tiene otros problemas mdicos, es posible que deba permanecer en el hospital por ms tiempo.  Mientras est en el hospital, recibir ayuda e instrucciones sobre cmo cuidar de usted misma y de su beb recin nacido durante el postparto.  Mientras est en el hospital:   Es normal que sienta dolor o molestias en la incisin en el abdomen. Asegrese de decirle a las enfermeras si siente dolor, as como donde siente el dolor y qu empeora el dolor.  Si est amamantando, puede sentir contracciones dolorosas en el tero durante algunas semanas. Esto es normal. Las contracciones ayudan a que el tero vuelva a su tamao normal.  Es normal tener algo de sangrado despus del parto.  Durante los primeros 1-3 das despus del parto, el flujo es de color rojo y la cantidad puede ser similar a un perodo.  Es frecuente que el flujo se inicie y se detenga.  En los primeros das, puede eliminar algunos cogulos pequeos. Informe a las enfermeras si comienza a eliminar cogulos grandes o aumenta el flujo.  No  elimine los cogulos de sangre por el inodoro antes de que la enfermera los vea.  Durante los prximos 3 a 10 das despus del parto, el flujo debe ser ms acuoso y rosado o marrn.  De diez a catorce das despus del parto, el flujo debe ser una pequea cantidad de secrecin de color blanco amarillento.  La cantidad de flujo disminuir en las primeras semanas despus del parto. El flujo puede detenerse en 6-8 semanas. La mayora de las mujeres no tienen ms flujo a las 12 semanas despus del parto.  Usted debe cambiar sus apsitos con frecuencia.  Lvese bien las manos con agua y jabn durante al menos 20 segundos despus de cambiar el apsito, usar el  bao o antes de sostener o alimentar a su recin nacido.  Se le quitar la va intravenosa (IV) cuando ya est bebiendo suficientes lquidos.  El tubo de drenaje para la orina (catter urinario) que se inserta antes del parto puede ser retirado luego de 6-8 horas despus del parto o cuando las piernas vuelvan a tener sensibilidad. Usted puede sentir que tiene que vaciar la vejiga durante las primeras 6-8 horas despus de que le quiten el catter.  Si se siente dbil, mareada o se desmaya, llame a su enfermera antes de levantarse de la cama por primera vez y antes de tomar una ducha por primera vez.  En los primeros das despus del parto, podr sentir las mamas sensibles y llenas. Esto se llama congestin. La sensibilidad en los senos por lo general desaparece dentro de las 48-72 horas despus de que ocurre la congestin. Tambin puede notar que la leche se escapa de sus senos. Si no est amamantando no estimule sus pechos. La estimulacin de las mamas hace que sus senos produzcan ms leche.  Pasar tanto tiempo como sea posible con el beb recin nacido es muy importante. Durante este tiempo, usted y su beb deben sentirse cerca y conocerse uno al otro. Tener al beb en su habitacin (alojamiento conjunto) ayudar a fortalecer el vnculo con el beb recin nacido. Esto le dar tiempo para conocerlo y atenderlo de manera cmoda.  Las hormonas se modifican despus del parto. A   veces, los cambios hormonales pueden causar tristeza o ganas de llorar por un tiempo. Estos sentimientos no deben durar ms de unos pocos das. Si duran ms que eso, debe hablar con su mdico.  Si lo desea, hable con su mdico acerca de los mtodos de planificacin familiar o mtodos anticonceptivos.  Hable con su mdico acerca de las vacunas. El mdico puede indicarle que se aplique las siguientes vacunas antes de salir del hospital:  Vacuna contra el ttanos, la difteria y la tos ferina (Tdap) o el ttanos y la difteria (Td).  Es muy importante que usted y su familia (incluyendo a los abuelos) u otras personas que cuidan al recin nacido estn al da con las vacunas Tdap o Td. Las vacunas Tdap o Td pueden ayudar a proteger al recin nacido de enfermedades.  Inmunizacin contra la rubola.  Inmunizacin contra la varicela.  Inmunizacin contra la gripe. Usted debe recibir esta vacunacin anual si no la ha recibido durante el embarazo.   Esta informacin no tiene como fin reemplazar el consejo del mdico. Asegrese de hacerle al mdico cualquier pregunta que tenga.   Document Released: 05/13/2012 Elsevier Interactive Patient Education 2016 [employer] Inc.  

## 2015-09-07 NOTE — Progress Notes (Signed)
Patient discharged home with infant. Vital signs stable, bleeding within normal limits, uterus firm. Discharge instructions, prescriptions, and follow up appointment given to and reviewed with patient. Patient verbalized understanding, all questions answered. Escorted in wheelchair by nursing.    

## 2020-02-09 DIAGNOSIS — U071 COVID-19: Secondary | ICD-10-CM

## 2020-02-09 HISTORY — DX: COVID-19: U07.1

## 2020-02-16 ENCOUNTER — Other Ambulatory Visit: Payer: Self-pay

## 2020-02-16 DIAGNOSIS — Z20822 Contact with and (suspected) exposure to covid-19: Secondary | ICD-10-CM

## 2020-02-18 LAB — SARS-COV-2, NAA 2 DAY TAT

## 2020-02-18 LAB — NOVEL CORONAVIRUS, NAA: SARS-CoV-2, NAA: DETECTED — AB

## 2020-02-19 ENCOUNTER — Encounter: Payer: Self-pay | Admitting: Nurse Practitioner

## 2020-02-19 ENCOUNTER — Telehealth: Payer: Self-pay | Admitting: Nurse Practitioner

## 2020-02-19 DIAGNOSIS — E663 Overweight: Secondary | ICD-10-CM | POA: Insufficient documentation

## 2020-02-19 NOTE — Telephone Encounter (Signed)
I called [facility] to discuss Covid symptoms and the use of casirivimab/imdevimab, a monoclonal antibody infusion for those with mild to moderate Covid symptoms and at a high risk of hospitalization.  Spanish interpreter services were used to complete the call.   Pt is qualified for this infusion at the monoclonal antibody infusion center due to co-morbid conditions and/or a member of an at-risk group, however declines infusion at this time. Symptoms tier reviewed as well as criteria for ending isolation.  Symptoms reviewed that would warrant ED/Hospital evaluation. Preventative practices reviewed. Patient verbalized understanding. Patient advised to call back if he decides that he does want to get infusion. Callback number to the infusion center given. Patient advised to go to Urgent care or ED with severe symptoms. Last date pt would be eligible for infusion is 9/14.   Patient Active Problem List   Diagnosis Date Noted  . Overweight (BMI 25.0-29.9)   . COVID-19 virus infection 02/2020    Murray Hodgkins, NP

## 2020-02-20 ENCOUNTER — Encounter: Payer: Self-pay | Admitting: Anesthesiology

## 2020-02-21 ENCOUNTER — Telehealth (HOSPITAL_COMMUNITY): Payer: Self-pay | Admitting: Adult Health

## 2020-02-21 NOTE — Telephone Encounter (Signed)
Called using [facility] [phone] and [facility] regarding monoclonal antibody treatment for COVID 19 given to those who are at risk for complications and/or hospitalization of the virus.  Patient meets criteria based on: social rsik  Call back number given: [phone]  My chart message:   Wilber Bihari, NP

## 2021-03-08 ENCOUNTER — Other Ambulatory Visit: Payer: Self-pay | Admitting: Certified Nurse Midwife

## 2021-03-08 DIAGNOSIS — N63 Unspecified lump in unspecified breast: Secondary | ICD-10-CM

## 2021-03-09 ENCOUNTER — Other Ambulatory Visit: Payer: Self-pay | Admitting: Emergency Medicine

## 2021-03-13 ENCOUNTER — Inpatient Hospital Stay
Admission: RE | Admit: 2021-03-13 | Discharge: 2021-03-13 | Disposition: A | Discharge status: Home / Self Care | Payer: Self-pay | Source: Ambulatory Visit | Attending: *Deleted | Admitting: *Deleted

## 2021-03-13 ENCOUNTER — Other Ambulatory Visit: Payer: Self-pay | Admitting: *Deleted

## 2021-03-13 DIAGNOSIS — Z1231 Encounter for screening mammogram for malignant neoplasm of breast: Secondary | ICD-10-CM

## 2021-03-20 ENCOUNTER — Other Ambulatory Visit: Payer: Self-pay | Admitting: Certified Nurse Midwife

## 2021-03-20 ENCOUNTER — Ambulatory Visit
Admission: RE | Admit: 2021-03-20 | Discharge: 2021-03-20 | Disposition: A | Discharge status: Home / Self Care | Payer: Self-pay | Source: Ambulatory Visit | Attending: Certified Nurse Midwife | Admitting: Certified Nurse Midwife

## 2021-03-20 ENCOUNTER — Other Ambulatory Visit: Payer: Self-pay

## 2021-03-20 DIAGNOSIS — N63 Unspecified lump in unspecified breast: Secondary | ICD-10-CM

## 2022-04-11 ENCOUNTER — Other Ambulatory Visit: Payer: Self-pay

## 2022-04-11 DIAGNOSIS — Z1231 Encounter for screening mammogram for malignant neoplasm of breast: Secondary | ICD-10-CM

## 2022-04-16 ENCOUNTER — Ambulatory Visit
Admission: RE | Admit: 2022-04-16 | Discharge: 2022-04-16 | Disposition: A | Discharge status: Home / Self Care | Payer: Self-pay | Source: Ambulatory Visit | Attending: Obstetrics and Gynecology | Admitting: Obstetrics and Gynecology

## 2022-04-16 ENCOUNTER — Ambulatory Visit: Payer: Self-pay | Attending: Family | Admitting: Hematology and Oncology

## 2022-04-16 VITALS — BP 125/79 | Wt 163.7 lb

## 2022-04-16 DIAGNOSIS — Z1231 Encounter for screening mammogram for malignant neoplasm of breast: Secondary | ICD-10-CM

## 2022-04-16 NOTE — Progress Notes (Signed)
[name] is a 41 y.o. female who presents to [facility] clinic today with no complaints.    Pap Smear: Pap not smear completed today. Last Pap smear was 2021 at [facility] clinic and was normal. Per patient has no history of an abnormal Pap smear. Last Pap smear result is not available in Epic.   Physical exam: Breasts Breasts symmetrical. No skin abnormalities bilateral breasts. No nipple retraction bilateral breasts. No nipple discharge bilateral breasts. No lymphadenopathy. No lumps palpated bilateral breasts.     MM DIAG BREAST TOMO BILATERAL  Result Date: 03/20/2021 CLINICAL DATA:  41 year old female presenting for evaluation of lumps in the bilateral axillary regions that have been present for approximately 6 years. EXAM: DIGITAL DIAGNOSTIC BILATERAL MAMMOGRAM WITH TOMOSYNTHESIS AND CAD; [place] AXILLARY LEFT; [place] AXILLARY RIGHT TECHNIQUE: Bilateral digital diagnostic mammography and breast tomosynthesis was performed. The images were evaluated with computer-aided detection.; Targeted ultrasound examination of the left axilla was performed.; Targeted ultrasound examination of the right axilla was performed. COMPARISON:  Previous exam(s). ACR Breast Density Category c: The breast tissue is heterogeneously dense, which may obscure small masses. FINDINGS: Mammogram: Right breast: A skin BB marks the palpable site of concern reported by the patient in the low right axilla. A spot tangential view of this area was performed in addition to standard views. There is only fatty tissue at the palpable site. There is no new abnormality at the palpable site or elsewhere in the right breast to suggest malignancy. Left breast: A skin BB marks the palpable site of concern reported by the patient in the low left axilla. A spot tangential view of this area was performed in addition to standard views. There is only fatty tissue at the palpable site. There is no new abnormality at the palpable site or elsewhere in  the left breast. On physical exam of the bilateral axillary regions I palpate a soft fullness without a fixed discrete mass or focal area of thickening. Ultrasound: Targeted ultrasound performed in the bilateral axillary regions demonstrating no cystic or solid mass. There is normal fat density tissue. IMPRESSION: No mammographic or sonographic evidence of malignancy or other imaging abnormality in the bilateral axillary regions. RECOMMENDATION: 1. Recommend any further workup of the bilateral palpable sites be on a clinical basis. 2.  Screening mammogram in one year.(Code:SM-B-01Y) I have discussed the findings and recommendations with the patient. If applicable, a reminder letter will be sent to the patient regarding the next appointment. BI-RADS CATEGORY  1: Negative. Electronically Signed   By: Audie Pinto M.D.   On: 03/20/2021 15:56     Pelvic/Bimanual Pap is not indicated today    Smoking History: Patient has never smoked and was not referred to quit line.    Patient Navigation: Patient education provided. Access to services provided for patient through [place] interpreter provided. No transportation provided   Colorectal Cancer Screening: Per patient has never had colonoscopy completed No complaints today.    Breast and Cervical Cancer Risk Assessment: Patient does not have family history of breast cancer, known genetic mutations, or radiation treatment to the chest before age 92. Patient does not have history of cervical dysplasia, immunocompromised, or DES exposure in-utero.  Risk Assessment   No risk assessment data     A: [facility] exam without pap smear No complaints with benign exam.   P: [facility] to [facility] for a [facility] mammogram. Appointment scheduled 04/16/22.  Melodye Ped, NP 04/16/2022 2:28 PM

## 2022-04-16 NOTE — Patient Instructions (Signed)
Taught Sheri Mack about breast self awareness. Patient did not need a Pap smear today due to last Pap smear was in 2021 per patient. Let her know [facility] will cover Pap smears every 5 years unless has a history of abnormal Pap smears. Referred [facility] to the [facility] for diagnostic mammogram. Appointment scheduled for 04/16/22. Patient aware of appointment and will be there. Let patient know will follow up with her within the next couple weeks with results. Sheri Mack verbalized understanding. She will return to clinic in one year for annual mammogram and Pap smear will be due in 2026.  Melodye Ped, NP 2:50 PM

## 2022-04-18 ENCOUNTER — Other Ambulatory Visit: Payer: Self-pay | Admitting: Obstetrics and Gynecology

## 2022-04-18 DIAGNOSIS — R928 Other abnormal and inconclusive findings on diagnostic imaging of breast: Secondary | ICD-10-CM

## 2022-04-18 DIAGNOSIS — N6489 Other specified disorders of breast: Secondary | ICD-10-CM

## 2022-05-09 ENCOUNTER — Ambulatory Visit
Admission: RE | Admit: 2022-05-09 | Discharge: 2022-05-09 | Disposition: A | Discharge status: Home / Self Care | Payer: Self-pay | Source: Ambulatory Visit | Attending: Obstetrics and Gynecology | Admitting: Obstetrics and Gynecology

## 2022-05-09 DIAGNOSIS — R928 Other abnormal and inconclusive findings on diagnostic imaging of breast: Secondary | ICD-10-CM

## 2022-05-09 DIAGNOSIS — N6489 Other specified disorders of breast: Secondary | ICD-10-CM

## 2023-06-08 IMAGING — MG DIGITAL DIAGNOSTIC BILAT W/ TOMO W/ CAD
6 of 12 series · 6 of 36 positions shown · non-contrast
Comparison: Previous exam(s).

CLINICAL DATA: 40-year-old female presenting for evaluation of
lumps in the bilateral axillary regions that have been present for
approximately 6 years.

EXAM:
DIGITAL DIAGNOSTIC BILATERAL MAMMOGRAM WITH TOMOSYNTHESIS AND CAD;
[place] AXILLARY LEFT; [place] AXILLARY RIGHT
TECHNIQUE: Bilateral digital diagnostic mammography and breast tomosynthesis
was performed. The images were evaluated with computer-aided
detection.; Targeted ultrasound examination of the left axilla was
performed.; Targeted ultrasound examination of the right axilla was
performed.

[R CC synth-2D]
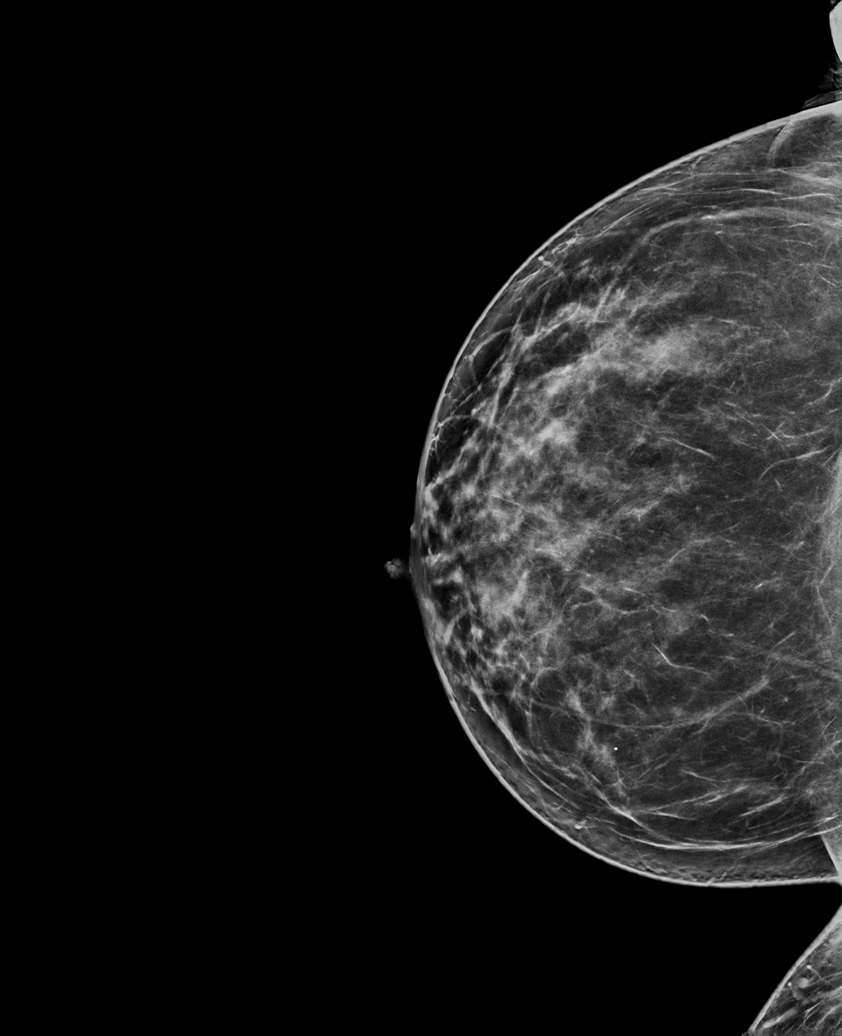

[L CC synth-2D]
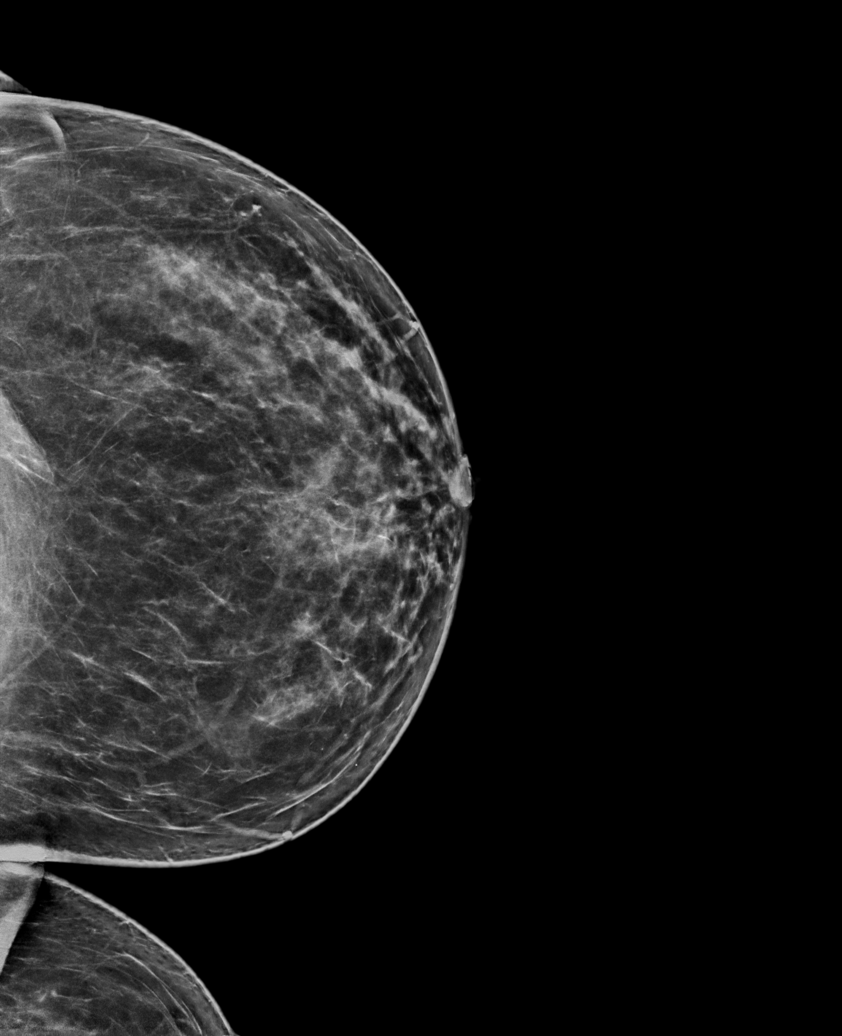

[R MLO synth-2D (1 of 2)]
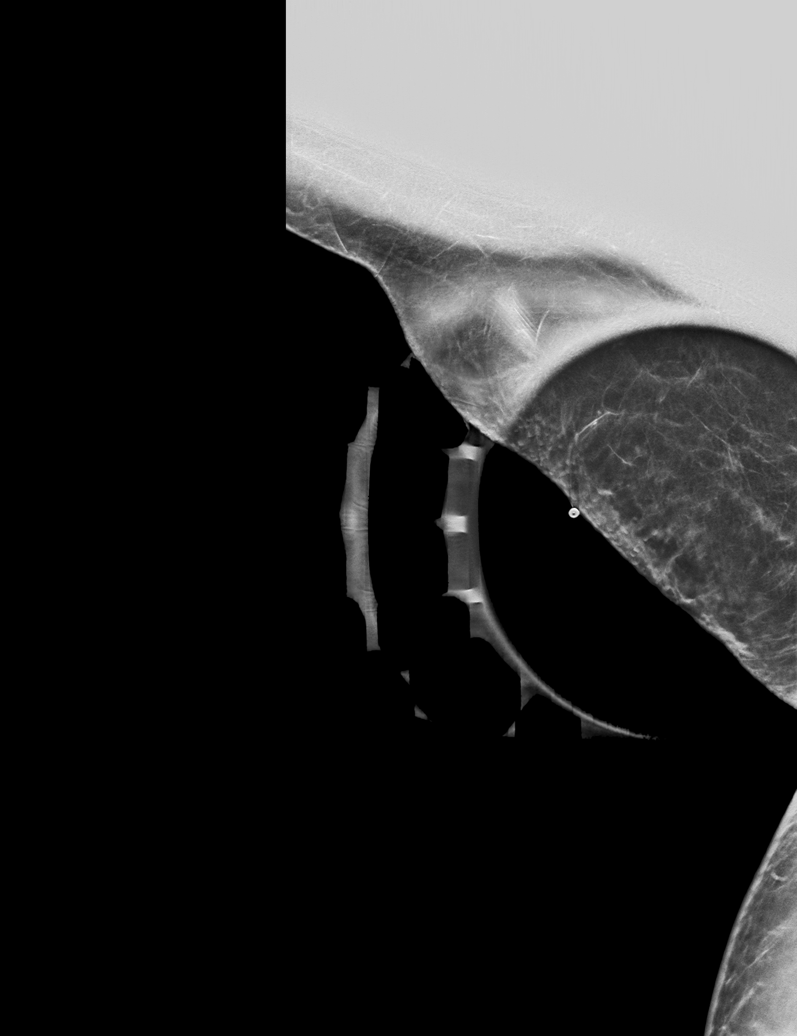

[L MLO synth-2D (1 of 2)]
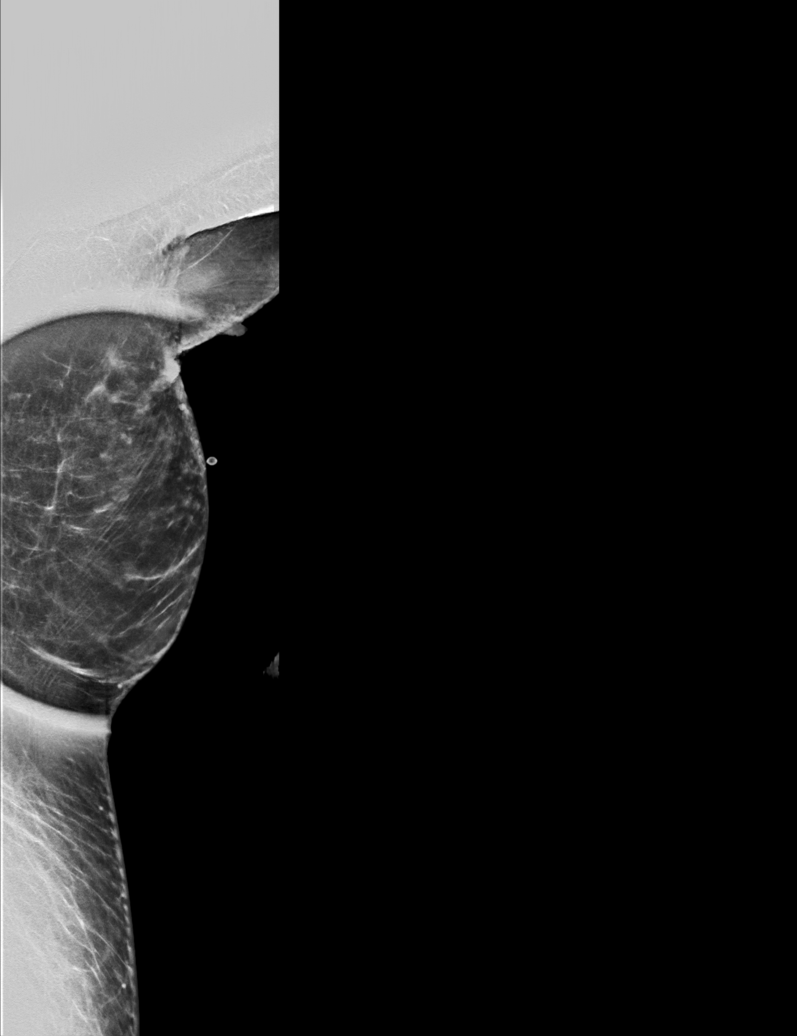

[R MLO synth-2D (2 of 2)]
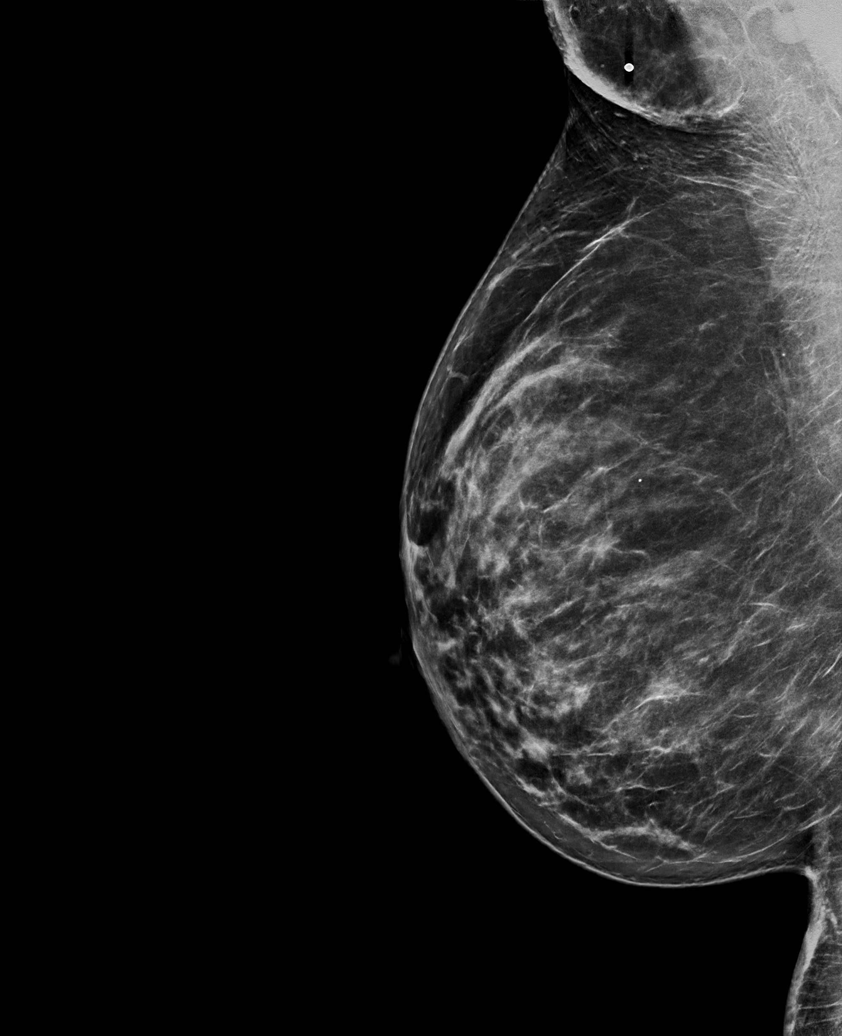

[L MLO synth-2D (2 of 2)]
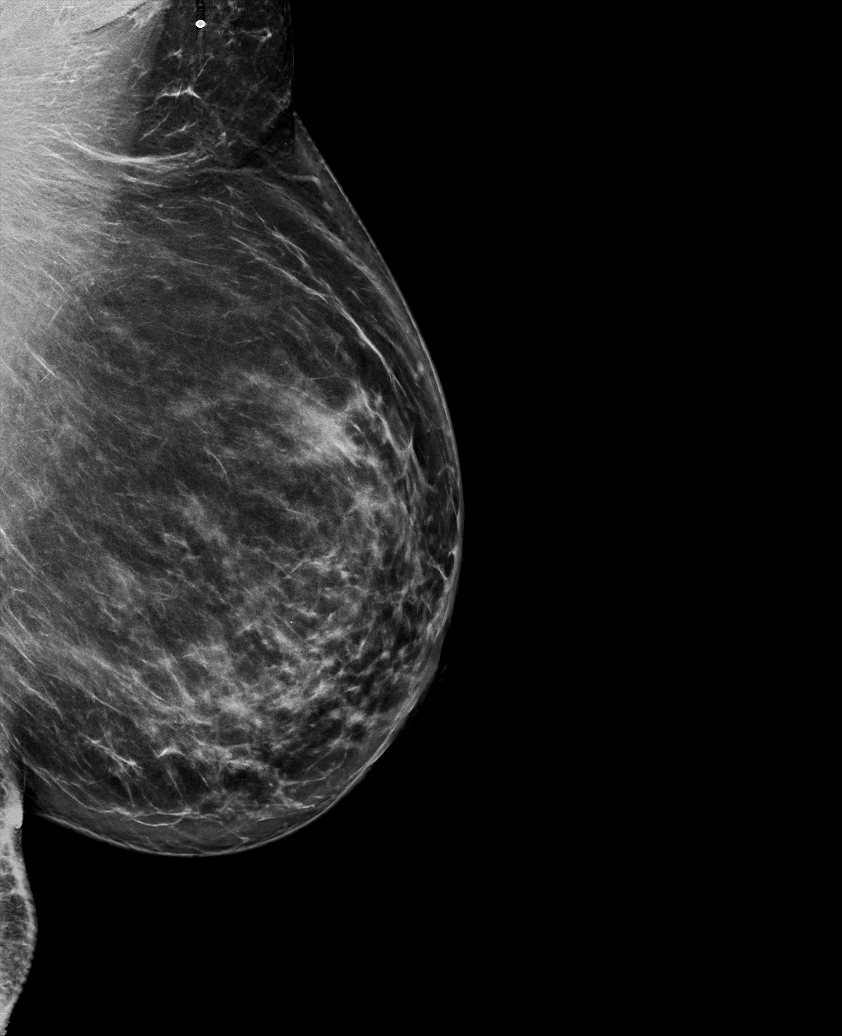

[6 of 36 positions shown; findings below may reference images not displayed]

ACR Breast Density Category c: The breast tissue is heterogeneously
dense, which may obscure small masses.
FINDINGS: Mammogram:

Right breast: A skin BB marks the palpable site of concern reported
by the patient in the low right axilla. A spot tangential view of
this area was performed in addition to standard views. There is only
fatty tissue at the palpable site. There is no new abnormality at
the palpable site or elsewhere in the right breast to suggest
malignancy.

Left breast: A skin BB marks the palpable site of concern reported
by the patient in the low left axilla. A spot tangential view of
this area was performed in addition to standard views. There is only
fatty tissue at the palpable site. There is no new abnormality at
the palpable site or elsewhere in the left breast.

On physical exam of the bilateral axillary regions I palpate a soft
fullness without a fixed discrete mass or focal area of thickening.

Ultrasound:

Targeted ultrasound performed in the bilateral axillary regions
demonstrating no cystic or solid mass. There is normal fat density
tissue.
IMPRESSION: No mammographic or sonographic evidence of malignancy or other
imaging abnormality in the bilateral axillary regions.

RECOMMENDATION:
1. Recommend any further workup of the bilateral palpable sites be
on a clinical basis.

2.  Screening mammogram in one year.(Code:RH-K-AH5)

I have discussed the findings and recommendations with the patient.
If applicable, a reminder letter will be sent to the patient
regarding the next appointment.

BI-RADS CATEGORY  1: Negative.

## 2024-04-08 ENCOUNTER — Telehealth: Payer: Self-pay

## 2024-04-23 ENCOUNTER — Other Ambulatory Visit: Payer: Self-pay | Admitting: Family Medicine

## 2024-04-23 DIAGNOSIS — Z1231 Encounter for screening mammogram for malignant neoplasm of breast: Secondary | ICD-10-CM

## 2024-06-11 ENCOUNTER — Ambulatory Visit
Admission: RE | Admit: 2024-06-11 | Discharge: 2024-06-11 | Disposition: A | Discharge status: Home / Self Care | Payer: Self-pay | Source: Ambulatory Visit | Attending: Family Medicine | Admitting: Family Medicine

## 2024-06-11 DIAGNOSIS — Z1231 Encounter for screening mammogram for malignant neoplasm of breast: Secondary | ICD-10-CM | POA: Insufficient documentation

## 2024-06-15 ENCOUNTER — Other Ambulatory Visit: Payer: Self-pay | Admitting: Obstetrics and Gynecology

## 2024-06-15 DIAGNOSIS — R928 Other abnormal and inconclusive findings on diagnostic imaging of breast: Secondary | ICD-10-CM

## 2024-06-21 ENCOUNTER — Encounter: Payer: Self-pay | Admitting: Family Medicine

## 2024-07-13 ENCOUNTER — Ambulatory Visit
Admission: RE | Admit: 2024-07-13 | Discharge: 2024-07-13 | Disposition: A | Payer: Self-pay | Source: Ambulatory Visit | Attending: Obstetrics and Gynecology | Admitting: Obstetrics and Gynecology

## 2024-07-13 ENCOUNTER — Ambulatory Visit: Payer: Self-pay

## 2024-07-13 VITALS — Ht 62.0 in | Wt 177.0 lb

## 2024-07-13 DIAGNOSIS — Z1231 Encounter for screening mammogram for malignant neoplasm of breast: Secondary | ICD-10-CM

## 2024-07-13 DIAGNOSIS — R928 Other abnormal and inconclusive findings on diagnostic imaging of breast: Secondary | ICD-10-CM
